# Patient Record
Sex: Male | Born: 1950 | Race: White | Hispanic: No | Marital: Married | Smoking: Former smoker
Health system: Southern US, Community
[De-identification: ages and names within clinical notes are randomized; demographics above are authoritative.]

## PROBLEM LIST (undated history)

## (undated) DIAGNOSIS — I2510 Atherosclerotic heart disease of native coronary artery without angina pectoris: Secondary | ICD-10-CM

## (undated) DIAGNOSIS — F32A Depression, unspecified: Secondary | ICD-10-CM

## (undated) DIAGNOSIS — K219 Gastro-esophageal reflux disease without esophagitis: Secondary | ICD-10-CM

## (undated) DIAGNOSIS — E079 Disorder of thyroid, unspecified: Secondary | ICD-10-CM

## (undated) DIAGNOSIS — M1990 Unspecified osteoarthritis, unspecified site: Secondary | ICD-10-CM

## (undated) DIAGNOSIS — F329 Major depressive disorder, single episode, unspecified: Secondary | ICD-10-CM

## (undated) DIAGNOSIS — C801 Malignant (primary) neoplasm, unspecified: Secondary | ICD-10-CM

## (undated) DIAGNOSIS — E039 Hypothyroidism, unspecified: Secondary | ICD-10-CM

## (undated) DIAGNOSIS — F1910 Other psychoactive substance abuse, uncomplicated: Secondary | ICD-10-CM

## (undated) HISTORY — DX: Atherosclerotic heart disease of native coronary artery without angina pectoris: I25.10

## (undated) HISTORY — DX: Major depressive disorder, single episode, unspecified: F32.9

## (undated) HISTORY — PX: APPENDECTOMY: SHX54

## (undated) HISTORY — DX: Disorder of thyroid, unspecified: E07.9

## (undated) HISTORY — DX: Depression, unspecified: F32.A

## (undated) HISTORY — DX: Other psychoactive substance abuse, uncomplicated: F19.10

## (undated) HISTORY — DX: Unspecified osteoarthritis, unspecified site: M19.90

## (undated) HISTORY — PX: LUMBAR DISC SURGERY: SHX700

---

## 2004-11-17 HISTORY — PX: NECK SURGERY: SHX720

## 2007-11-18 HISTORY — PX: MEDIAL PARTIAL KNEE REPLACEMENT: SHX5965

## 2008-01-12 ENCOUNTER — Inpatient Hospital Stay (HOSPITAL_COMMUNITY): Admission: RE | Admit: 2008-01-12 | Discharge: 2008-01-15 | Payer: Self-pay | Admitting: Orthopedic Surgery

## 2008-03-28 ENCOUNTER — Ambulatory Visit: Payer: Self-pay

## 2009-03-12 ENCOUNTER — Ambulatory Visit: Payer: Self-pay | Admitting: Internal Medicine

## 2009-03-12 ENCOUNTER — Encounter: Payer: Self-pay | Admitting: Internal Medicine

## 2009-03-12 DIAGNOSIS — Z860100 Personal history of colon polyps, unspecified: Secondary | ICD-10-CM | POA: Insufficient documentation

## 2009-03-12 DIAGNOSIS — R05 Cough: Secondary | ICD-10-CM | POA: Insufficient documentation

## 2009-03-12 DIAGNOSIS — Z8659 Personal history of other mental and behavioral disorders: Secondary | ICD-10-CM

## 2009-03-12 DIAGNOSIS — S6710XA Crushing injury of unspecified finger(s), initial encounter: Secondary | ICD-10-CM | POA: Insufficient documentation

## 2009-03-12 DIAGNOSIS — K279 Peptic ulcer, site unspecified, unspecified as acute or chronic, without hemorrhage or perforation: Secondary | ICD-10-CM

## 2009-03-12 DIAGNOSIS — M159 Polyosteoarthritis, unspecified: Secondary | ICD-10-CM | POA: Insufficient documentation

## 2009-03-12 DIAGNOSIS — Z8709 Personal history of other diseases of the respiratory system: Secondary | ICD-10-CM | POA: Insufficient documentation

## 2009-03-14 ENCOUNTER — Encounter (INDEPENDENT_AMBULATORY_CARE_PROVIDER_SITE_OTHER): Payer: Self-pay | Admitting: *Deleted

## 2009-04-02 ENCOUNTER — Ambulatory Visit: Payer: Self-pay | Admitting: Internal Medicine

## 2009-04-20 ENCOUNTER — Telehealth: Payer: Self-pay | Admitting: Internal Medicine

## 2009-05-08 ENCOUNTER — Telehealth: Payer: Self-pay | Admitting: Internal Medicine

## 2009-05-28 ENCOUNTER — Ambulatory Visit: Payer: Self-pay | Admitting: Gastroenterology

## 2009-06-13 ENCOUNTER — Ambulatory Visit: Payer: Self-pay | Admitting: Gastroenterology

## 2009-06-19 ENCOUNTER — Telehealth: Payer: Self-pay | Admitting: Internal Medicine

## 2009-07-12 ENCOUNTER — Ambulatory Visit: Payer: Self-pay | Admitting: Gastroenterology

## 2009-09-03 ENCOUNTER — Ambulatory Visit: Payer: Self-pay | Admitting: Internal Medicine

## 2009-09-03 DIAGNOSIS — M959 Acquired deformity of musculoskeletal system, unspecified: Secondary | ICD-10-CM

## 2009-09-04 ENCOUNTER — Telehealth: Payer: Self-pay | Admitting: Internal Medicine

## 2009-09-26 ENCOUNTER — Encounter (INDEPENDENT_AMBULATORY_CARE_PROVIDER_SITE_OTHER): Payer: Self-pay | Admitting: *Deleted

## 2010-02-25 ENCOUNTER — Telehealth: Payer: Self-pay | Admitting: Internal Medicine

## 2010-04-11 ENCOUNTER — Telehealth: Payer: Self-pay | Admitting: Internal Medicine

## 2010-04-18 ENCOUNTER — Telehealth: Payer: Self-pay | Admitting: Internal Medicine

## 2010-12-08 ENCOUNTER — Encounter: Payer: Self-pay | Admitting: Internal Medicine

## 2010-12-17 NOTE — Progress Notes (Signed)
  Prescriptions: VALIUM 10 MG TABS (DIAZEPAM) 1/2 - 1 by mouth three times a day as needed for spasms  #60 x 3   Entered and Authorized by:   Etta Grandchild MD   Signed by:   Etta Grandchild MD on 04/18/2010   Method used:   Print then Give to Patient   RxID:   [id number]   Appended Document:  Spoke with patient and rx called in to [facility] [address] ([phone])/[place]

## 2010-12-17 NOTE — Progress Notes (Signed)
  Phone Note Refill Request Message from:  Fax from Pharmacy on Apr 11, 2010 10:51 AM  Refills Requested: Medication #1:  VALIUM 10 MG TABS 1/2 - 1 by mouth three times a day as needed for spasms   Last Refilled: 4/12/20111   Please advise if ok to refill for pt?  Initial call taken by: Rock Nephew CMA,  Apr 11, 2010 10:52 AM  Follow-up for Phone Call        needs to be seen Follow-up by: Etta Grandchild MD,  Apr 11, 2010 11:24 AM     Appended Document:  Informed pt that he needs to be seen and he said he would like the MD to return his call. Pt wants to know why he has to come in. I informed pt that he hasn't been seen since October and he stated he doesn't have the money to throw around for an appt to get an RX? Please advise?

## 2010-12-17 NOTE — Progress Notes (Signed)
Summary: REFILL  Phone Note Refill Request Call back at Work Phone [phone]   Refills Requested: Medication #1:  OXYCODONE HCL 10 MG TABS 1-2 by mouth three times a day as needed for pain  Medication #2:  VALIUM 10 MG TABS 1/2 - 1 by mouth three times a day as needed for spasms Initial call taken by: Lamar Sprinkles, CMA,  February 25, 2010 1:19 PM  Follow-up for Phone Call        ok Follow-up by: Etta Grandchild MD,  February 25, 2010 1:26 PM  Additional Follow-up for Phone Call Additional follow up Details #1::        Pt informed  Additional Follow-up by: Lamar Sprinkles, CMA,  February 26, 2010 8:10 AM    Prescriptions: SYMBICORT 80-4.5 MCG/ACT AERO (BUDESONIDE-FORMOTEROL FUMARATE) 2 puffs BID  #2 inhs x 0   Entered by:   Lamar Sprinkles, CMA   Authorized by:   Etta Grandchild MD   Signed by:   Lamar Sprinkles, CMA on 02/26/2010   Method used:   Samples Given   RxID:   [id number] VALIUM 10 MG TABS (DIAZEPAM) 1/2 - 1 by mouth three times a day as needed for spasms  #60 x 0   Entered by:   Lamar Sprinkles, CMA   Authorized by:   Etta Grandchild MD   Signed by:   Lamar Sprinkles, CMA on 02/26/2010   Method used:   Print then Give to Patient   RxID:   [id number] OXYCODONE HCL 10 MG TABS (OXYCODONE HCL) 1-2 by mouth three times a day as needed for pain  #100 x 0   Entered by:   Lamar Sprinkles, CMA   Authorized by:   Etta Grandchild MD   Signed by:   Lamar Sprinkles, CMA on 02/26/2010   Method used:   Print then Give to Patient   RxID:   [id number]

## 2011-04-01 NOTE — Discharge Summary (Signed)
[name], [name]                ACCOUNT NO.:  [id number]   MEDICAL RECORD NO.:  [id number]          PATIENT TYPE:  INP   LOCATION:  5038                         FACILITY:  [facility]   PHYSICIAN:  Eulas Post, MD    DATE OF BIRTH:  02-Dec-1950   DATE OF ADMISSION:  01/12/2008  DATE OF DISCHARGE:  01/15/2008                               DISCHARGE SUMMARY   ADMISSION DIAGNOSIS:  Right knee unicompartment arthritis.   DISCHARGE DIAGNOSIS:  Right knee unicompartment arthritis.   SECONDARY DIAGNOSIS:  Narcotic tolerance.   DISCHARGE MEDICATIONS:  1. Coumadin for a period of 4 weeks postoperatively per pharmacy      protocol.  2. Valium 10 mg p.o. t.i.d. p.r.n. muscle spasms.  3. Norco 10/325 1-2 tablets p.o. q.6 h. p.r.n. pain, dispensing 90      with 1 refill.  4. OxyContin continuous release 20 mg p.o. b.i.d.  5. Sleeping medication 8 mg p.o. q.h.s. p.r.n., dispensing 30 with no      refills.  6. Resume other home medications except for narcotics.   HOSPITAL COURSE:  [name] Haney elected to undergo a right  unicompartmental knee arthroplasty.  He tolerated the procedure well and  there were no complications.  He was given PCA morphine in the early  postoperative period and then transitioned to oral pain medications.  He  has a history of cervical spine fusion and requires a fair amount of  narcotics.  He was given DVT prophylaxis in the form of sequential  compression devices as well as Coumadin postoperatively.  He was also  given perioperative antibiotics.  He is working with physical therapy  and doing well at the time of discharge.  He used the CPM early on, but  had excruciating pain and what he considers to be a setback because of  the CPM, so we have discontinued the CPM.   He is planned to be discharged home with follow up with me in  approximately 4 weeks.  All the prescriptions have been given.  The  patient had maximum benefit from his hospital stay and there  were no  complications.      Eulas Post, MD  Electronically Signed     JPL/MEDQ  D:  01/14/2008  T:  01/15/2008  Job:  [id number]

## 2011-04-01 NOTE — Op Note (Signed)
[name] Haney, [name] Haney                ACCOUNT NO.:  [id number]   MEDICAL RECORD NO.:  [id number]          PATIENT TYPE:  INP   LOCATION:  2550                         FACILITY:  [facility]   PHYSICIAN:  Eulas Post, MD    DATE OF BIRTH:  Dec 15, 1950   DATE OF PROCEDURE:  01/12/2008  DATE OF DISCHARGE:                               OPERATIVE REPORT   PREOPERATIVE DIAGNOSIS:  Right knee medial compartment osteoarthritis.   POSTOPERATIVE DIAGNOSIS:  Right knee medial compartment osteoarthritis.   OPERATION PERFORMED:  Right knee medial compartment unicompartmental  knee arthroplasty.   SURGEON:  Eulas Post, MD   FIRST ASSISTANT:  Loreta Ave, M.D.   ANESTHESIA:  General with a femoral nerve block.   ESTIMATED BLOOD LOSS:  Minimal.   TOURNIQUET TIME:  Two hours.   ANTIBIOTICS:  1 g intravenous Ancef given preoperatively.   OPERATIVE IMPLANTS:  Stryker PKR size 3 tibia, size 3 femur, size 9 mm  polyethylene insert.   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  There was evidence of a prior meniscectomy.  The knee had full range of motion both before and after surgery.  It was  stable to varus and valgus stress postoperatively and had a normal  amount of medial laxity.  It reached full extension postoperatively.  His patellofemoral joint was normal.  His lateral joint was normal.  His  ACL was intact.  His PCL was intact.   INDICATIONS FOR PROCEDURE:  [name] is a 60 year old man who  had a prior right knee open subtotal medial meniscectomy performed in  1970.  He presented for medial sided knee pain and elected to undergo  the above named procedure.  The risks, benefits and alternatives of  operative intervention were discussed with him preoperatively including  but not limited to the risks of infection, bleeding, nerve injury, blood  clots, cardiopulmonary complications, pulmonary embolus, the need for  blood transfusion, knee stiffness, loss of function, the need for  revision surgery to a total knee among others and the patient is willing  to proceed.   DESCRIPTION OF PROCEDURE:  The patient was brought to the operating room  and placed in supine position.  Femoral nerve block is administered.  General anesthesia was given.  1 g intravenous Ancef was given.  The  right lower extremity was prepped and draped in the usual sterile  fashion.  Tourniquet was applied and set for 300.  After sterile prep  and drape, incision was made using the parapatellar approach.  Incision  was not carried out superior to the superior pole of the patella.  Exposure was obtained and the medial meniscus was excised and  osteophytes were removed.  We assembled our jig and made a tibial cut.  We then adjusted the jig appropriately as we needed to take more tibia.  A second tibial cut was performed.  Care was taken not to undercut the  tibial spine.  The medial collateral ligament was protected.  Once the  tibial cut had been achieved,  we then checked our flexion and extension  gap and assembled our femoral jig and performed our distal femoral  resection followed by posterior cut and our chamfer cut.  Care was taken  to recess the femoral component such that it does not enter into the  patellar contact region.  Trial components were placed and the knee was  taken through a range of motion and found to have excellent stability  with satisfactory motion.  We then prepared our tibia with the  appropriate drill and prepared our femur, irrigated the knee copiously.  One liter of fluid was irrigated prior to cementing and one liter was  irrigated after cementing.  We then cemented all the components into  placed.  Excellent bony conformity was achieved and the knee was  irrigated copiously and the parapatellar tissue closed with 0-0 Vicryl  followed by 2-0 in the subcutaneous tissue and Monocryl for the skin.   Steri-Strips were placed.  The knee was injected with Marcaine, total of  20 mL.  Sterile dressing was applied followed by TED hose.  The patient  was awakened and returned to PACU in stable and satisfactory condition.  There were no complications.  The patient tolerated the procedure well.      Eulas Post, MD  Electronically Signed     JPL/MEDQ  D:  01/12/2008  T:  01/13/2008  Job:  [id number] [id number]

## 2011-04-18 ENCOUNTER — Other Ambulatory Visit (INDEPENDENT_AMBULATORY_CARE_PROVIDER_SITE_OTHER): Payer: PRIVATE HEALTH INSURANCE

## 2011-04-18 ENCOUNTER — Encounter: Payer: Self-pay | Admitting: Internal Medicine

## 2011-04-18 ENCOUNTER — Ambulatory Visit (INDEPENDENT_AMBULATORY_CARE_PROVIDER_SITE_OTHER): Payer: PRIVATE HEALTH INSURANCE | Admitting: Internal Medicine

## 2011-04-18 ENCOUNTER — Other Ambulatory Visit: Payer: Self-pay | Admitting: Internal Medicine

## 2011-04-18 DIAGNOSIS — J45909 Unspecified asthma, uncomplicated: Secondary | ICD-10-CM

## 2011-04-18 DIAGNOSIS — M1990 Unspecified osteoarthritis, unspecified site: Secondary | ICD-10-CM

## 2011-04-18 DIAGNOSIS — N529 Male erectile dysfunction, unspecified: Secondary | ICD-10-CM

## 2011-04-18 DIAGNOSIS — M545 Low back pain, unspecified: Secondary | ICD-10-CM

## 2011-04-18 LAB — CBC WITH DIFFERENTIAL/PLATELET
Basophils Relative: 0.6 % (ref 0.0–3.0)
Eosinophils Relative: 1.2 % (ref 0.0–5.0)
HCT: 44.6 % (ref 39.0–52.0)
Hemoglobin: 15.5 g/dL (ref 13.0–17.0)
Lymphs Abs: 3.4 10*3/uL (ref 0.7–4.0)
MCV: 91.8 fl (ref 78.0–100.0)
Monocytes Absolute: 0.8 10*3/uL (ref 0.1–1.0)
Neutro Abs: 7.4 10*3/uL (ref 1.4–7.7)
Platelets: 259 10*3/uL (ref 150.0–400.0)
WBC: 11.9 10*3/uL — ABNORMAL HIGH (ref 4.5–10.5)

## 2011-04-18 LAB — COMPREHENSIVE METABOLIC PANEL
ALT: 10 U/L (ref 0–53)
AST: 14 U/L (ref 0–37)
CO2: 29 mEq/L (ref 19–32)
Calcium: 9.1 mg/dL (ref 8.4–10.5)
Chloride: 106 mEq/L (ref 96–112)
GFR: 61.3 mL/min (ref >60.00)
Potassium: 4.4 mEq/L (ref 3.5–5.1)
Sodium: 141 mEq/L (ref 135–145)
Total Protein: 7.1 g/dL (ref 6.0–8.3)

## 2011-04-18 LAB — URINALYSIS, ROUTINE W REFLEX MICROSCOPIC
Bilirubin Urine: NEGATIVE
Nitrite: NEGATIVE
Total Protein, Urine: NEGATIVE
Urine Glucose: NEGATIVE
pH: 5.5 (ref 5.0–8.0)

## 2011-04-18 LAB — LIPID PANEL
Cholesterol: 265 mg/dL — ABNORMAL HIGH (ref 0–200)
Total CHOL/HDL Ratio: 6
VLDL: 42 mg/dL — ABNORMAL HIGH (ref 0.0–40.0)

## 2011-04-18 LAB — HEMOGLOBIN A1C: Hgb A1c MFr Bld: 5.6 % (ref 4.6–6.5)

## 2011-04-18 LAB — TSH: TSH: 6.66 u[IU]/mL — ABNORMAL HIGH (ref 0.35–5.50)

## 2011-04-18 LAB — LDL CHOLESTEROL, DIRECT: Direct LDL: 222 mg/dL

## 2011-04-18 MED ORDER — MOMETASONE FURO-FORMOTEROL FUM 200-5 MCG/ACT IN AERO: 2.0000 | INHALATION_SPRAY | Freq: Two times a day (BID) | RESPIRATORY_TRACT | Status: DC

## 2011-04-18 MED ORDER — DIAZEPAM 10 MG PO TABS: 10.0000 mg | ORAL_TABLET | Freq: Three times a day (TID) | ORAL | Status: DC | PRN

## 2011-04-18 MED ORDER — OXYCODONE HCL 10 MG PO TB12: 10.0000 mg | ORAL_TABLET | Freq: Two times a day (BID) | ORAL | Status: DC | PRN

## 2011-04-18 NOTE — Assessment & Plan Note (Signed)
I want him to restart an inhaler so I gave him samples of [facility]

## 2011-04-18 NOTE — Assessment & Plan Note (Signed)
I will check labs to look for secondary causes of ED and ask him to come back in for a fasting physical soon

## 2011-04-18 NOTE — Progress Notes (Signed)
Subjective:    Patient ID: [name] Haney, male    DOB: 1951/05/20, 60 y.o.   MRN: [id number]  Back Pain This is a chronic problem. The current episode started more than 1 year ago. The problem occurs intermittently. The problem is unchanged. The pain is present in the lumbar spine. The quality of the pain is described as aching. The pain does not radiate. The pain is at a severity of 4/10. The pain is mild. The pain is worse during the day. The symptoms are aggravated by bending. Stiffness is present all day. Pertinent negatives include no abdominal pain, bladder incontinence, bowel incontinence, chest pain, dysuria, fever, headaches, leg pain, numbness, paresis, paresthesias, pelvic pain, perianal numbness, tingling, weakness or weight loss. He has tried analgesics for the symptoms. The treatment provided significant relief.  Erectile Dysfunction This is a new problem. The current episode started more than 1 month ago. The problem is unchanged. The nature of his difficulty is achieving erection, maintaining erection and penetration. Non-physiologic factors contributing to erectile dysfunction are a decreased libido. He reports no anxiety or performance anxiety. He reports his erection duration to be 1 to 5 minutes. Irritative symptoms do not include frequency, nocturia or urgency. Obstructive symptoms do not include dribbling, incomplete emptying, an intermittent stream, a slower stream, straining or a weak stream. Pertinent negatives include no chills, dysuria, genital pain, hematuria, hesitancy or inability to urinate. The symptoms are aggravated by nothing. Past treatments include nothing.  Asthma He complains of cough and wheezing. There is no chest tightness, difficulty breathing, frequent throat clearing, hemoptysis, shortness of breath or sputum production. This is a chronic problem. The current episode started more than 1 year ago. The problem occurs intermittently. The problem has been unchanged.  The cough is non-productive. Pertinent negatives include no appetite change, chest pain, dyspnea on exertion, ear congestion, ear pain, fever, headaches, heartburn, malaise/fatigue, myalgias, nasal congestion, orthopnea, PND, postnasal drip, rhinorrhea, sneezing, sore throat, sweats, trouble swallowing or weight loss. His symptoms are aggravated by pollen and exposure to smoke. His symptoms are alleviated by steroid inhaler and beta-agonist. He reports moderate improvement on treatment. His past medical history is significant for asthma. There is no history of bronchiectasis, bronchitis, COPD, emphysema or pneumonia.      Review of Systems  Constitutional: Positive for fatigue and unexpected weight change (weight gain). Negative for fever, chills, weight loss, malaise/fatigue, diaphoresis, activity change and appetite change.  HENT: Negative for ear pain, sore throat, facial swelling, rhinorrhea, sneezing, trouble swallowing, neck pain, neck stiffness and postnasal drip.   Eyes: Negative for photophobia, redness and visual disturbance.  Respiratory: Positive for cough and wheezing. Negative for apnea, hemoptysis, sputum production, choking, chest tightness, shortness of breath and stridor.   Cardiovascular: Negative for chest pain, dyspnea on exertion, palpitations, leg swelling and PND.  Gastrointestinal: Negative for heartburn, nausea, vomiting, abdominal pain, diarrhea, constipation, blood in stool, abdominal distention, anal bleeding, rectal pain and bowel incontinence.  Genitourinary: Positive for decreased libido. Negative for bladder incontinence, dysuria, hesitancy, urgency, frequency, hematuria, flank pain, decreased urine volume, discharge, penile swelling, scrotal swelling, enuresis, difficulty urinating, genital sores, penile pain, testicular pain, pelvic pain, incomplete emptying and nocturia.  Musculoskeletal: Positive for back pain. Negative for myalgias, joint swelling, arthralgias and  gait problem.  Skin: Negative for color change, pallor and rash.  Neurological: Negative for dizziness, tingling, tremors, seizures, syncope, facial asymmetry, speech difficulty, weakness, light-headedness, numbness, headaches and paresthesias.  Hematological: Negative for adenopathy. Does not bruise/bleed easily.  Psychiatric/Behavioral: Negative for suicidal ideas, hallucinations, behavioral problems, confusion, sleep disturbance, self-injury, dysphoric mood, decreased concentration and agitation. The patient is not nervous/anxious and is not hyperactive.        Objective:   Physical Exam  [vitalsreviewed. Constitutional: He is oriented to person, place, and time. He appears well-developed and well-nourished. No distress.  HENT:  Head: Normocephalic and atraumatic.  Right Ear: External ear normal.  Left Ear: External ear normal.  Nose: Nose normal.  Mouth/Throat: Oropharynx is clear and moist. No oropharyngeal exudate.  Eyes: Conjunctivae and EOM are normal. Pupils are equal, round, and reactive to light. Right eye exhibits no discharge. Left eye exhibits no discharge. No scleral icterus.  Neck: Normal range of motion. Neck supple. No JVD present. No tracheal deviation present. No thyromegaly present.  Cardiovascular: Normal rate, regular rhythm, normal heart sounds and intact distal pulses.  Exam reveals no gallop and no friction rub.   No murmur heard. Pulmonary/Chest: Effort normal. No stridor. No respiratory distress. He has wheezes (he has mild diffuse inspiratory wheezes but good air movement). He has no rales. He exhibits no tenderness.  Abdominal: Soft. Bowel sounds are normal. He exhibits no distension and no mass. There is no tenderness. There is no rebound and no guarding.  Musculoskeletal: Normal range of motion. He exhibits no edema and no tenderness.       Lumbar back: Normal. He exhibits normal range of motion, no tenderness, no bony tenderness, no swelling, no edema, no  pain and no spasm.  Lymphadenopathy:    He has no cervical adenopathy.  Neurological: He is alert and oriented to person, place, and time. He displays no atrophy, no tremor and normal reflexes. No cranial nerve deficit or sensory deficit. He exhibits normal muscle tone. He displays no seizure activity. Coordination and gait normal. He displays no Babinski's sign on the right side. He displays no Babinski's sign on the left side.  Reflex Scores:      Tricep reflexes are 1+ on the right side and 1+ on the left side.      Bicep reflexes are 1+ on the right side and 1+ on the left side.      Brachioradialis reflexes are 1+ on the right side and 1+ on the left side.      Patellar reflexes are 1+ on the right side and 1+ on the left side.      Achilles reflexes are 1+ on the right side and 1+ on the left side. Skin: Skin is warm and dry. No rash noted. He is not diaphoretic. No erythema. No pallor.  Psychiatric: He has a normal mood and affect. His behavior is normal. Judgment and thought content normal.          Assessment & Plan:

## 2011-04-18 NOTE — Assessment & Plan Note (Signed)
I gave him refills on the meds he takes for pain and spasms, he has no evidence of radiculopathy

## 2011-04-18 NOTE — Patient Instructions (Signed)
Asthma, Adult Asthma is caused by narrowing of the air passages in the lungs. It may be triggered by pollen, dust, animal dander, molds, some foods, respiratory infections, exposure to smoke, exercise, emotional stress or other allergens (things that cause allergic reactions or allergies). Repeat attacks are common. HOME CARE INSTRUCTIONS  Use prescription medications as ordered by your caregiver.   Avoid pollen, dust, animal dander, molds, smoke and other things that cause attacks at home and at work.   You may have fewer attacks if you decrease dust in your home. Electrostatic air cleaners may help.   It may help to replace your pillows or mattress with materials less likely to cause allergies.   Talk to your caregiver about an action plan for managing asthma attacks at home, including, the use of a peak flow meter which measures the severity of your asthma attack. An action plan can help minimize or stop the attack without having to seek medical care.   If you are not on a fluid restriction, drink 8 to 10 glasses of water each day.   Always have a plan prepared for seeking medical attention, including, calling your physician, accessing local emergency care, and calling 911 (in the [place] for a severe attack.   Discuss possible exercise routines with your caregiver.   If animal dander is the cause of asthma, you may need to get rid of pets.  SEEK MEDICAL CARE IF:  You have wheezing and shortness of breath even if taking medicine to prevent attacks.   An oral temperature above 100.5 develops.   You have muscle aches, chest pain or thickening of sputum.   Your sputum changes from clear or white to yellow, green, gray or bloody.   You have any problems that may be related to the medicine you are taking (such as a rash, itching, swelling or trouble breathing).  SEEK IMMEDIATE MEDICAL CARE IF:  Your usual medicines do not stop your wheezing or there is increased coughing and/or  shortness of breath.   You have increased difficulty breathing.  You have an oral temperature above 100.5Back Pain (Lumbosacral Strain) Back pain is one of the most common causes of pain. There are many causes of back pain. Most are not serious conditions.  CAUSES Your backbone (spinal column) is made up of 24 main vertebral bodies, the sacrum, and the coccyx. These are held together by muscles and tough, fibrous tissue (ligaments). Nerve roots pass through the openings between the vertebrae. A sudden move or injury to the back may cause injury to, or pressure on, these nerves. This may result in localized back pain or pain movement (radiation) into the buttocks, down the leg, and into the foot. Sharp, shooting pain from the buttock down the back of the leg (sciatica) is frequently associated with a ruptured (herniated) disc. Pain may be caused by muscle spasm alone. Your caregiver can often find the cause of your pain by the details of your symptoms and an exam. In some cases, you may need tests (such as X-rays). Your caregiver will work with you to decide if any tests are needed based on your specific exam. HOME CARE INSTRUCTIONS Avoid an underactive lifestyle. Active exercise, as directed by your caregiver, is your greatest weapon against back pain.  Avoid hard physical activities (tennis, racquetball, water-skiing) if you are not in proper physical condition for it. This may aggravate and/or create problems.  If you have a back problem, avoid sports requiring sudden body movements. Swimming and walking  are generally safer activities.  Maintain good posture.  Avoid becoming overweight (obese).  Use bed rest for only the most extreme, sudden (acute) episode. Your caregiver will help you determine how much bed rest is necessary.  For acute conditions, you may put ice on the injured area.  Put ice in a plastic bag.  Place a towel between your skin and the bag.  Leave the ice on for 20 minutes at a  time, every 2 hours, or as needed.  After you are improved and more active, it may help to apply heat for 30 minutes before activities.  See your caregiver if you are having pain that lasts longer than expected. Your caregiver can advise appropriate exercises and/or therapy if needed. With conditioning, most back problems can be avoided. SEEK IMMEDIATE MEDICAL CARE IF: You have numbness, tingling, weakness, or problems with the use of your arms or legs.  You experience severe back pain not relieved with medicines.  There is a change in bowel or bladder control.  You have increasing pain in any area of the body, including your belly (abdomen).  You notice shortness of breath, dizziness, or feel faint.  You feel sick to your stomach (nauseous), are throwing up (vomiting), or become sweaty.  You notice discoloration of your toes or legs, or your feet get very cold.  Your back pain is getting worse.  You have an oral temperature above 100.5, not controlled by medicine.  MAKE SURE YOU:  Understand these instructions.  Will watch your condition.  Will get help right away if you are not doing well or get worse.  Document Released: 08/13/2005 Document Re-Released: 01/28/2010  [facility] Patient Information 2011 [place], [place]., not controlled by medicine.  MAKE SURE YOU:  Understand these instructions.   Will watch your condition.   Will get help right away if you are not doing well or get worse.  Document Released: 11/03/2005 Document Re-Released: 11/25/2009 [facility] Patient Information 2011 [place], [place].

## 2011-04-20 ENCOUNTER — Encounter: Payer: Self-pay | Admitting: Internal Medicine

## 2011-04-21 LAB — TESTOSTERONE, FREE, TOTAL, SHBG
Sex Hormone Binding: 21 nmol/L (ref 13–71)
Testosterone, Free: 45.5 pg/mL — ABNORMAL LOW (ref 47.0–244.0)

## 2011-04-25 ENCOUNTER — Telehealth: Payer: Self-pay | Admitting: *Deleted

## 2011-04-25 ENCOUNTER — Encounter: Payer: Self-pay | Admitting: Internal Medicine

## 2011-04-25 ENCOUNTER — Ambulatory Visit (INDEPENDENT_AMBULATORY_CARE_PROVIDER_SITE_OTHER): Payer: PRIVATE HEALTH INSURANCE | Admitting: Internal Medicine

## 2011-04-25 VITALS — BP 112/68 | HR 58 | Temp 97.9°F | Resp 16 | Wt 226.0 lb

## 2011-04-25 DIAGNOSIS — E291 Testicular hypofunction: Secondary | ICD-10-CM

## 2011-04-25 DIAGNOSIS — E039 Hypothyroidism, unspecified: Secondary | ICD-10-CM | POA: Insufficient documentation

## 2011-04-25 DIAGNOSIS — N529 Male erectile dysfunction, unspecified: Secondary | ICD-10-CM

## 2011-04-25 DIAGNOSIS — Z23 Encounter for immunization: Secondary | ICD-10-CM

## 2011-04-25 DIAGNOSIS — E7800 Pure hypercholesterolemia, unspecified: Secondary | ICD-10-CM

## 2011-04-25 MED ORDER — PITAVASTATIN CALCIUM 4 MG PO TABS: 1.0000 | ORAL_TABLET | Freq: Every day | ORAL | Status: DC

## 2011-04-25 MED ORDER — LEVOTHYROXINE SODIUM 50 MCG PO TABS: 50.0000 ug | ORAL_TABLET | Freq: Every day | ORAL | Status: DC

## 2011-04-25 MED ORDER — TESTOSTERONE 50 MG/5GM (1%) TD GEL: 5.0000 g | Freq: Every day | TRANSDERMAL | Status: DC

## 2011-04-25 MED ORDER — TESTOSTERONE 20.25 MG/ACT (1.62%) TD GEL: 2.0000 | Freq: Every day | TRANSDERMAL | Status: DC

## 2011-04-25 NOTE — Assessment & Plan Note (Signed)
Start Livalo 

## 2011-04-25 NOTE — Telephone Encounter (Signed)
Patient requesting alt rx to testosterone RX given today. He can not afford RX.

## 2011-04-25 NOTE — Assessment & Plan Note (Signed)
Start synthroid

## 2011-04-25 NOTE — Assessment & Plan Note (Signed)
Start androgel pump

## 2011-04-25 NOTE — Assessment & Plan Note (Signed)
Treat the low testosterone and hypothyroidism and look for improvement

## 2011-04-25 NOTE — Telephone Encounter (Signed)
Please call in testim instead

## 2011-04-25 NOTE — Progress Notes (Signed)
Subjective:    Patient ID: [name] Haney, male    DOB: 12/04/1950, 60 y.o.   MRN: [id number]  Erectile Dysfunction This is a chronic problem. The current episode started more than 1 year ago. The problem has been gradually worsening since onset. The nature of his difficulty is achieving erection, maintaining erection and penetration. Non-physiologic factors contributing to erectile dysfunction are a decreased libido. He reports no anxiety or performance anxiety. Irritative symptoms do not include frequency, nocturia or urgency. Obstructive symptoms do not include dribbling, incomplete emptying, an intermittent stream, a slower stream, straining or a weak stream. Pertinent negatives include no chills, dysuria, genital pain, hematuria, hesitancy or inability to urinate. The symptoms are aggravated by medications. Past treatments include nothing.  Hyperlipidemia This is a new problem. The problem is uncontrolled. Recent lipid tests were reviewed and are high. Exacerbating diseases include obesity. He has no history of chronic renal disease, diabetes, liver disease or nephrotic syndrome. Factors aggravating his hyperlipidemia include fatty foods. Pertinent negatives include no chest pain, focal sensory loss, focal weakness, leg pain, myalgias or shortness of breath. He is currently on no antihyperlipidemic treatment. Compliance problems include adherence to diet and adherence to exercise.       Review of Systems  Constitutional: Positive for fatigue. Negative for fever, chills, diaphoresis, activity change, appetite change and unexpected weight change.  HENT: Negative for sore throat, facial swelling, trouble swallowing, neck pain, neck stiffness and voice change.   Eyes: Negative for photophobia and visual disturbance.  Respiratory: Negative for cough, choking, chest tightness, shortness of breath, wheezing and stridor.   Cardiovascular: Negative for chest pain, palpitations and leg swelling.    Gastrointestinal: Negative for nausea, abdominal pain, diarrhea, constipation, blood in stool, abdominal distention, anal bleeding and rectal pain.  Genitourinary: Positive for decreased libido. Negative for dysuria, hesitancy, urgency, frequency, hematuria, flank pain, decreased urine volume, discharge, penile swelling, scrotal swelling, enuresis, difficulty urinating, genital sores, penile pain, testicular pain, incomplete emptying and nocturia.  Musculoskeletal: Negative for myalgias, back pain, joint swelling, arthralgias and gait problem.  Skin: Negative for color change, pallor and rash.  Neurological: Negative for dizziness, tremors, focal weakness, seizures, syncope, facial asymmetry, speech difficulty, weakness, light-headedness, numbness and headaches.  Hematological: Negative for adenopathy. Does not bruise/bleed easily.  Psychiatric/Behavioral: Positive for dysphoric mood and decreased concentration. Negative for suicidal ideas, hallucinations, behavioral problems, confusion, sleep disturbance, self-injury and agitation. The patient is not nervous/anxious and is not hyperactive.        Objective:   Physical Exam  Vitals reviewed. Constitutional: He is oriented to person, place, and time. He appears well-developed and well-nourished. No distress.  HENT:  Head: Normocephalic and atraumatic.  Right Ear: External ear normal.  Left Ear: External ear normal.  Nose: Nose normal.  Mouth/Throat: Oropharynx is clear and moist. No oropharyngeal exudate.  Eyes: Conjunctivae and EOM are normal. Pupils are equal, round, and reactive to light. Right eye exhibits no discharge. Left eye exhibits no discharge. No scleral icterus.  Neck: Normal range of motion. Neck supple. No JVD present. No tracheal deviation present. No thyromegaly present.  Cardiovascular: Normal rate, regular rhythm, normal heart sounds and intact distal pulses.  Exam reveals no gallop and no friction rub.   No murmur  heard. Pulmonary/Chest: Effort normal and breath sounds normal. No respiratory distress. He has no wheezes. He has no rales. He exhibits no tenderness.  Abdominal: Soft. Bowel sounds are normal. He exhibits no distension and no mass. There is no tenderness.  There is no rebound and no guarding. Hernia confirmed negative in the right inguinal area and confirmed negative in the left inguinal area.  Genitourinary: Rectum normal, testes normal and penis normal. Rectal exam shows no external hemorrhoid, no internal hemorrhoid, no fissure, no mass, no tenderness and anal tone normal. Guaiac negative stool. Prostate is not enlarged and not tender. Right testis shows no mass, no swelling and no tenderness. Left testis shows no mass, no swelling and no tenderness. No penile erythema or penile tenderness. No discharge found.  Musculoskeletal: Normal range of motion. He exhibits no edema and no tenderness.  Lymphadenopathy:    He has no cervical adenopathy.       Right: No inguinal adenopathy present.       Left: No inguinal adenopathy present.  Neurological: He is alert and oriented to person, place, and time. He has normal reflexes. He displays normal reflexes. No cranial nerve deficit. He exhibits normal muscle tone. Coordination normal.  Skin: Skin is warm and dry. No rash noted. He is not diaphoretic. No erythema. No pallor.  Psychiatric: He has a normal mood and affect. His behavior is normal. Judgment and thought content normal.         Lab Results  Component Value Date   WBC 11.9* 04/18/2011   HGB 15.5 04/18/2011   HCT 44.6 04/18/2011   PLT 259.0 04/18/2011   CHOL 265* 04/18/2011   TRIG 210.0* 04/18/2011   HDL 43.40 04/18/2011   LDLDIRECT 222.0 04/18/2011   ALT 10 04/18/2011   AST 14 04/18/2011   NA 141 04/18/2011   K 4.4 04/18/2011   CL 106 04/18/2011   CREATININE 1.3 04/18/2011   BUN 19 04/18/2011   CO2 29 04/18/2011   TSH 6.66* 04/18/2011   PSA 0.40 04/18/2011   HGBA1C 5.6 04/18/2011   Assessment & Plan:

## 2011-04-25 NOTE — Patient Instructions (Signed)
Hypothyroidism The thyroid is a large gland located in the lower front of your neck. The thyroid gland helps control metabolism. Metabolism is how your body handles food. It controls metabolism with the hormone thyroxine. When this gland is underactive (hypothyroid), it produces too little hormone.  SYMPTOMS OF HYPOTHYROIDISM  Lethargy (feeling as though you have no energy)   Cold intolerance   Weight gain (in spite of normal food intake)   Dry skin   Coarse hair   Menstrual irregularity (if severe, may lead to infertility)   Slowing of thought processes  Cardiac problems are also caused by insufficient amounts of thyroid hormone. Hypothyroidism in the newborn is cretinism, and is an extreme form. It is important that this form be treated adequately and immediately or it will lead rapidly to retarded physical and mental development. CAUSES OF HYPOTHYROIDISM These include:   Absence or destruction of thyroid tissue.  Goiter due to iodine deficiency.   Goiter due to medications.   Congenital defects (since birth).  Problems with the pituitary. This causes a lack of TSH (thyroid stimulating hormone). This hormone tells the thyroid to turn out more hormone.   DIAGNOSIS To prove hypothyroidism, your caregiver may do blood tests and ultrasound tests. Sometimes the signs are hidden. It may be necessary for your caregiver to watch this illness with blood tests either before or after diagnosis and treatment. TREATMENT  Low levels of thyroid hormone are increased by using synthetic thyroid hormone. This is a safe, effective treatment. It usually takes about four weeks to gain the full effects of the medication. After you have the full effect of the medication, it will generally take another four weeks for problems to leave. Your caregiver may start you on low doses. If you have had heart problems the dose may be gradually increased. It is generally not an emergency to get rapidly to  normal. HOME CARE INSTRUCTIONS  Take your medications as your caregiver suggests. Let your caregiver know of any medications you are taking or start taking. Your caregiver will help you with dosage schedules.   As your condition improves, your dosage needs may increase. It will be necessary to have continuing blood tests as suggested by your caregiver.   Report all suspected medication side effects to your caregiver.  SEEK MEDICAL CARE IF YOU DEVELOP:  Sweating.  Tremulousness (tremors).   Anxiety.   Rapid weight loss.   Heat intolerance.  Emotional swings.   Diarrhea.   Weakness.   SEEK IMMEDIATE MEDICAL CARE IF: You develop chest pain, an irregular heart beat (palpitations), or a rapid heart beat. MAKE SURE YOU:   Understand these instructions.   Will watch your condition.   Will get help right away if you are not doing well or get worse.  Document Released: 11/03/2005 Document Re-Released: 10/16/2008 [employer] Patient Information 2011 [employer]. 

## 2011-04-28 NOTE — Telephone Encounter (Signed)
Spoke w/pt. He uses [facility] [address]. Attempted to call pharm and check price of testim. Busy x 3, will try later.

## 2011-04-29 NOTE — Telephone Encounter (Signed)
Called into pharm, they req I call back to give them time to run RX.

## 2011-04-29 NOTE — Telephone Encounter (Signed)
Androderm is too expensive and testim is not covered at all. What do you advise?

## 2011-04-30 MED ORDER — TESTOSTERONE CYPIONATE 200 MG/ML IM SOLN: 200.0000 mg | INTRAMUSCULAR | Status: DC

## 2011-04-30 NOTE — Telephone Encounter (Signed)
injection

## 2011-04-30 NOTE — Telephone Encounter (Signed)
Pt aware, faxed rx to [facility] [address].

## 2011-05-02 ENCOUNTER — Ambulatory Visit (INDEPENDENT_AMBULATORY_CARE_PROVIDER_SITE_OTHER): Payer: PRIVATE HEALTH INSURANCE

## 2011-05-02 DIAGNOSIS — E291 Testicular hypofunction: Secondary | ICD-10-CM

## 2011-05-02 MED ORDER — TESTOSTERONE CYPIONATE 200 MG/ML IM SOLN
200.0000 mg | Freq: Once | INTRAMUSCULAR | Status: AC
  Administered 2011-05-02: 200 mg via INTRAMUSCULAR

## 2011-05-08 ENCOUNTER — Ambulatory Visit: Payer: PRIVATE HEALTH INSURANCE | Admitting: Internal Medicine

## 2011-06-26 ENCOUNTER — Ambulatory Visit (INDEPENDENT_AMBULATORY_CARE_PROVIDER_SITE_OTHER)
Admission: RE | Admit: 2011-06-26 | Discharge: 2011-06-26 | Disposition: A | Discharge status: Home / Self Care | Payer: PRIVATE HEALTH INSURANCE | Source: Ambulatory Visit | Attending: Internal Medicine | Admitting: Internal Medicine

## 2011-06-26 ENCOUNTER — Ambulatory Visit (INDEPENDENT_AMBULATORY_CARE_PROVIDER_SITE_OTHER): Payer: PRIVATE HEALTH INSURANCE | Admitting: Internal Medicine

## 2011-06-26 ENCOUNTER — Encounter: Payer: Self-pay | Admitting: Internal Medicine

## 2011-06-26 VITALS — BP 118/86 | HR 68 | Temp 97.6°F | Resp 16

## 2011-06-26 DIAGNOSIS — S99912A Unspecified injury of left ankle, initial encounter: Secondary | ICD-10-CM

## 2011-06-26 DIAGNOSIS — M7660 Achilles tendinitis, unspecified leg: Secondary | ICD-10-CM

## 2011-06-26 DIAGNOSIS — S99919A Unspecified injury of unspecified ankle, initial encounter: Secondary | ICD-10-CM

## 2011-06-26 DIAGNOSIS — S99929A Unspecified injury of unspecified foot, initial encounter: Secondary | ICD-10-CM

## 2011-06-26 NOTE — Assessment & Plan Note (Signed)
I gave him pt ed material about achilles tendonitis

## 2011-06-26 NOTE — Assessment & Plan Note (Signed)
I will check a plain xray today to look for fracture, etc
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
## 2011-06-26 NOTE — Patient Instructions (Signed)
Achilles Tendonitis (Tendinitis) Tendonitis a swelling and soreness of the tendon. The pain in the tendon (cord like structure which attaches muscle to bone) is produced by tiny tears and the inflammation present in that tendon. It commonly occurs at the shoulders, heels, and elbows. It is usually caused by overusing the tendon and joint involved. Achilles tendonitis involves the Achilles tendon. This is the large tendon in the back of the leg just above the foot. It attaches the large muscles of the lower leg to the heel bone (called calcaneus).  This diagnosis (learning what is wrong) is made by examination. X-rays will be generally be normal if only tendonitis is present. HOME CARE INSTRUCTIONS  Apply ice to the injury for 20 minutes 2 times per day. Put the ice in a plastic bag and place a towel between the bag of ice and your skin.   Try to avoid use other than gentle range of motion while the tendon is painful. Do not resume use until instructed by your caregiver. Then begin use gradually. Do not increase use to the point of pain. If pain does develop, decrease use and continue the above measures. Gradually increase activities that do not cause discomfort until you gradually achieve normal use.   Only take over-the-counter or prescription medicines for pain, discomfort, or fever as directed by your caregiver.  SEEK MEDICAL CARE IF:  Your pain and swelling increase or pain is uncontrolled with medications.   You develop new, unexplained problems (symptoms) or an increase of the symptoms that brought you to your caregiver.   You develop an inability to move your toes or foot, develop warmth and swelling in your foot, or begin running an unexplained temperature.  MAKE SURE YOU:   Understand these instructions.   Will watch your condition.   Will get help right away if you are not doing well or get worse.  Document Released: 08/13/2005 Document Re-Released: 01/30/2009 [facility] Patient  Information 2011 [place], [place].

## 2011-06-27 ENCOUNTER — Other Ambulatory Visit: Payer: Self-pay | Admitting: Internal Medicine

## 2011-06-27 DIAGNOSIS — S99912A Unspecified injury of left ankle, initial encounter: Secondary | ICD-10-CM

## 2011-06-27 DIAGNOSIS — M7660 Achilles tendinitis, unspecified leg: Secondary | ICD-10-CM

## 2011-07-24 ENCOUNTER — Telehealth: Payer: Self-pay

## 2011-07-24 MED ORDER — TESTOSTERONE CYPIONATE 200 MG/ML IM SOLN: 200.0000 mg | INTRAMUSCULAR | Status: DC

## 2011-07-24 NOTE — Telephone Encounter (Signed)
Rx sent to pharmacy   

## 2011-07-24 NOTE — Telephone Encounter (Signed)
[facility], please advise if ok

## 2011-07-24 NOTE — Telephone Encounter (Signed)
yes

## 2011-08-01 ENCOUNTER — Ambulatory Visit: Payer: PRIVATE HEALTH INSURANCE | Admitting: Internal Medicine

## 2011-08-08 LAB — BASIC METABOLIC PANEL
BUN: 11
BUN: 16
CO2: 23
CO2: 27
CO2: 29
CO2: 30
Calcium: 9.2
Chloride: 102
Chloride: 105
Creatinine, Ser: 1.04
GFR calc Af Amer: >60
GFR calc non Af Amer: >60
Glucose, Bld: 114 — ABNORMAL HIGH
Glucose, Bld: 118 — ABNORMAL HIGH
Glucose, Bld: 97
Potassium: 3.5
Potassium: 4
Potassium: 4.2
Potassium: 4.9
Sodium: 135
Sodium: 139

## 2011-08-08 LAB — URINALYSIS, ROUTINE W REFLEX MICROSCOPIC
Bilirubin Urine: NEGATIVE
Ketones, ur: NEGATIVE
Nitrite: NEGATIVE
Urobilinogen, UA: 0.2

## 2011-08-08 LAB — PROTIME-INR
INR: 0.9
Prothrombin Time: 12.1

## 2011-08-08 LAB — CBC
HCT: 36.6 — ABNORMAL LOW
HCT: 36.9 — ABNORMAL LOW
HCT: 38.7 — ABNORMAL LOW
Hemoglobin: 12.6 — ABNORMAL LOW
Hemoglobin: 12.8 — ABNORMAL LOW
Hemoglobin: 13.4
MCHC: 34.3
MCV: 90.8
MCV: 91.3
Platelets: 234
Platelets: 269
RBC: 4.01 — ABNORMAL LOW
RDW: 13.8
RDW: 13.9
RDW: 13.9

## 2011-08-08 LAB — APTT: aPTT: 28

## 2011-08-08 LAB — TYPE AND SCREEN

## 2011-10-31 ENCOUNTER — Telehealth: Payer: Self-pay

## 2011-10-31 DIAGNOSIS — M545 Low back pain: Secondary | ICD-10-CM

## 2011-10-31 MED ORDER — DIAZEPAM 10 MG PO TABS: 10.0000 mg | ORAL_TABLET | Freq: Three times a day (TID) | ORAL | Status: DC | PRN

## 2011-10-31 NOTE — Telephone Encounter (Signed)
yes

## 2011-10-31 NOTE — Telephone Encounter (Signed)
Rx called in to [facility] per pt

## 2011-10-31 NOTE — Telephone Encounter (Signed)
Please advise if ok to refill diazepam for pt Thanks

## 2011-12-09 ENCOUNTER — Telehealth: Payer: Self-pay

## 2011-12-09 NOTE — Telephone Encounter (Signed)
Received fax from pharmacy stating that per pt "testosterone enan has stopped working since the only product available is from [facility]". He wants to try testosterone cypionate (10cc vial), please advise Thanks

## 2011-12-09 NOTE — Telephone Encounter (Signed)
He needs to be seen

## 2012-01-15 ENCOUNTER — Encounter: Payer: Self-pay | Admitting: Internal Medicine

## 2012-01-15 ENCOUNTER — Ambulatory Visit (INDEPENDENT_AMBULATORY_CARE_PROVIDER_SITE_OTHER): Payer: BC Managed Care – PPO | Admitting: Internal Medicine

## 2012-01-15 ENCOUNTER — Telehealth: Payer: Self-pay

## 2012-01-15 ENCOUNTER — Other Ambulatory Visit (INDEPENDENT_AMBULATORY_CARE_PROVIDER_SITE_OTHER): Payer: BC Managed Care – PPO

## 2012-01-15 VITALS — BP 102/80 | HR 64 | Temp 97.5°F | Resp 18 | Wt 197.2 lb

## 2012-01-15 DIAGNOSIS — Z136 Encounter for screening for cardiovascular disorders: Secondary | ICD-10-CM

## 2012-01-15 DIAGNOSIS — M545 Low back pain: Secondary | ICD-10-CM

## 2012-01-15 DIAGNOSIS — Z0000 Encounter for general adult medical examination without abnormal findings: Secondary | ICD-10-CM | POA: Insufficient documentation

## 2012-01-15 DIAGNOSIS — E291 Testicular hypofunction: Secondary | ICD-10-CM

## 2012-01-15 LAB — LDL CHOLESTEROL, DIRECT: Direct LDL: 194.5 mg/dL

## 2012-01-15 LAB — COMPREHENSIVE METABOLIC PANEL
ALT: 8 U/L (ref 0–53)
AST: 15 U/L (ref 0–37)
Albumin: 3.7 g/dL (ref 3.5–5.2)
CO2: 30 mEq/L (ref 19–32)
Calcium: 8.9 mg/dL (ref 8.4–10.5)
Chloride: 102 mEq/L (ref 96–112)
Creatinine, Ser: 1.1 mg/dL (ref 0.4–1.5)
GFR: 72.95 mL/min (ref >60.00)
Potassium: 4.5 mEq/L (ref 3.5–5.1)

## 2012-01-15 LAB — CBC WITH DIFFERENTIAL/PLATELET
Basophils Absolute: 0 10*3/uL (ref 0.0–0.1)
Eosinophils Absolute: 0 10*3/uL (ref 0.0–0.7)
Hemoglobin: 18 g/dL (ref 13.0–17.0)
Lymphocytes Relative: 23.2 % (ref 12.0–46.0)
MCHC: 33.2 g/dL (ref 30.0–36.0)
Monocytes Relative: 7.6 % (ref 3.0–12.0)
Neutrophils Relative %: 68.2 % (ref 43.0–77.0)
Platelets: 249 10*3/uL (ref 150.0–400.0)
RDW: 16.1 % — ABNORMAL HIGH (ref 11.5–14.6)

## 2012-01-15 LAB — URINALYSIS, ROUTINE W REFLEX MICROSCOPIC
Ketones, ur: NEGATIVE
Nitrite: NEGATIVE
Specific Gravity, Urine: >=1.03 (ref 1.000–1.030)
Total Protein, Urine: NEGATIVE
pH: 6 (ref 5.0–8.0)

## 2012-01-15 LAB — PSA: PSA: 0.98 ng/mL (ref 0.10–4.00)

## 2012-01-15 LAB — LIPID PANEL
Cholesterol: 245 mg/dL — ABNORMAL HIGH (ref 0–200)
Total CHOL/HDL Ratio: 7
VLDL: 27 mg/dL (ref 0.0–40.0)

## 2012-01-15 MED ORDER — OXYCODONE HCL 10 MG PO TABS: 10.0000 mg | ORAL_TABLET | Freq: Two times a day (BID) | ORAL | Status: DC | PRN

## 2012-01-15 MED ORDER — TESTOSTERONE CYPIONATE 200 MG/ML IM SOLN: 200.0000 mg | INTRAMUSCULAR | Status: DC

## 2012-01-15 MED ORDER — "SYRINGE 25G X 1"" 3 ML MISC": Status: DC

## 2012-01-15 MED ORDER — OXYCODONE HCL 10 MG PO TB12: 10.0000 mg | ORAL_TABLET | Freq: Two times a day (BID) | ORAL | Status: DC | PRN

## 2012-01-15 NOTE — Telephone Encounter (Signed)
Pharmacy called stating that with insurance pt cannot afford the oxycontin 10mg /12hr. Patient is requesting that rx is switched to oxycondone 10 mg immediate-release tabs. Please advise if ok and clarify direction please. Thanks

## 2012-01-15 NOTE — Telephone Encounter (Signed)
done

## 2012-01-15 NOTE — Assessment & Plan Note (Addendum)
Exam done, labs ordered, vaccines updated, his EKG is normal, he was referred for a colonoscopy

## 2012-01-15 NOTE — Patient Instructions (Signed)
Health Maintenance, Males A healthy lifestyle and preventative care can promote health and wellness.  Maintain regular health, dental, and eye exams.   Eat a healthy diet. Foods like vegetables, fruits, whole grains, low-fat dairy products, and lean protein foods contain the nutrients you need without too many calories. Decrease your intake of foods high in solid fats, added sugars, and salt. Get information about a proper diet from your caregiver, if necessary.   Regular physical exercise is one of the most important things you can do for your health. Most adults should get at least 150 minutes of moderate-intensity exercise (any activity that increases your heart rate and causes you to sweat) each week. In addition, most adults need muscle-strengthening exercises on 2 or more days a week.    Maintain a healthy weight. The body mass index (BMI) is a screening tool to identify possible weight problems. It provides an estimate of body fat based on height and weight. Your caregiver can help determine your BMI, and can help you achieve or maintain a healthy weight. For adults 20 years and older:   A BMI below 18.5 is considered underweight.   A BMI of 18.5 to 24.9 is normal.   A BMI of 25 to 29.9 is considered overweight.   A BMI of 30 and above is considered obese.   Maintain normal blood lipids and cholesterol by exercising and minimizing your intake of saturated fat. Eat a balanced diet with plenty of fruits and vegetables. Blood tests for lipids and cholesterol should begin at age 20 and be repeated every 5 years. If your lipid or cholesterol levels are high, you are over 50, or you are a high risk for heart disease, you may need your cholesterol levels checked more frequently.Ongoing high lipid and cholesterol levels should be treated with medicines, if diet and exercise are not effective.   If you smoke, find out from your caregiver how to quit. If you do not use tobacco, do not start.    If you choose to drink alcohol, do not exceed 2 drinks per day. One drink is considered to be 12 ounces (355 mL) of beer, 5 ounces (148 mL) of wine, or 1.5 ounces (44 mL) of liquor.   Avoid use of street drugs. Do not share needles with anyone. Ask for help if you need support or instructions about stopping the use of drugs.   High blood pressure causes heart disease and increases the risk of stroke. Blood pressure should be checked at least every 1 to 2 years. Ongoing high blood pressure should be treated with medicines if weight loss and exercise are not effective.   If you are 45 to 62 years old, ask your caregiver if you should take aspirin to prevent heart disease.   Diabetes screening involves taking a blood sample to check your fasting blood sugar level. This should be done once every 3 years, after age 45, if you are within normal weight and without risk factors for diabetes. Testing should be considered at a younger age or be carried out more frequently if you are overweight and have at least 1 risk factor for diabetes.   Colorectal cancer can be detected and often prevented. Most routine colorectal cancer screening begins at the age of 50 and continues through age 75. However, your caregiver may recommend screening at an earlier age if you have risk factors for colon cancer. On a yearly basis, your caregiver may provide home test kits to check for hidden   blood in the stool. Use of a small camera at the end of a tube, to directly examine the colon (sigmoidoscopy or colonoscopy), can detect the earliest forms of colorectal cancer. Talk to your caregiver about this at age 50, when routine screening begins. Direct examination of the colon should be repeated every 5 to 10 years through age 75, unless early forms of pre-cancerous polyps or small growths are found.   Healthy men should no longer receive prostate-specific antigen (PSA) blood tests as part of routine cancer screening. Consult with  your caregiver about prostate cancer screening.   Practice safe sex. Use condoms and avoid high-risk sexual practices to reduce the spread of sexually transmitted infections (STIs).   Use sunscreen with a sun protection factor (SPF) of 30 or greater. Apply sunscreen liberally and repeatedly throughout the day. You should seek shade when your shadow is shorter than you. Protect yourself by wearing long sleeves, pants, a wide-brimmed hat, and sunglasses year round, whenever you are outdoors.   Notify your caregiver of new moles or changes in moles, especially if there is a change in shape or color. Also notify your caregiver if a mole is larger than the size of a pencil eraser.   A one-time screening for abdominal aortic aneurysm (AAA) and surgical repair of large AAAs by sound wave imaging (ultrasonography) is recommended for ages 65 to 75 years who are current or former smokers.   Stay current with your immunizations.  Document Released: 05/01/2008 Document Revised: 07/16/2011 Document Reviewed: 03/31/2011 [employer] Patient Information 2012 [employer]. 

## 2012-01-15 NOTE — Telephone Encounter (Signed)
Pharmacy notified via phone call to [facility]

## 2012-01-15 NOTE — Progress Notes (Signed)
  Subjective:    Patient ID: [name] Haney, male    DOB: December 02, 1950, 61 y.o.   MRN: [id number]  HPI He returns for a complete physical and he offers no complaints.   Review of Systems  Constitutional: Negative for fever, chills, diaphoresis, activity change, appetite change, fatigue and unexpected weight change.  HENT: Negative.   Eyes: Negative.   Respiratory: Negative for apnea, cough, choking, chest tightness, shortness of breath, wheezing and stridor.   Cardiovascular: Negative for chest pain, palpitations and leg swelling.  Gastrointestinal: Negative for nausea, vomiting, abdominal pain, diarrhea, constipation, blood in stool, abdominal distention, anal bleeding and rectal pain.  Genitourinary: Negative.   Musculoskeletal: Positive for back pain (chronic, unchanged) and arthralgias (chronic, unchanged). Negative for myalgias, joint swelling and gait problem.  Skin: Negative for color change, pallor, rash and wound.  Neurological: Negative for dizziness, tremors, seizures, syncope, facial asymmetry, speech difficulty, weakness, light-headedness, numbness and headaches.  Hematological: Negative for adenopathy. Does not bruise/bleed easily.  Psychiatric/Behavioral: Negative.        Objective:   Physical Exam  Vitals reviewed. Constitutional: He is oriented to person, place, and time. He appears well-developed and well-nourished. No distress.  HENT:  Head: Normocephalic and atraumatic.  Mouth/Throat: Oropharynx is clear and moist. No oropharyngeal exudate.  Eyes: Conjunctivae are normal. Right eye exhibits no discharge. Left eye exhibits no discharge. No scleral icterus.  Neck: Normal range of motion. Neck supple. No JVD present. No tracheal deviation present. No thyromegaly present.  Cardiovascular: Normal rate, regular rhythm, normal heart sounds and intact distal pulses.  Exam reveals no gallop and no friction rub.   No murmur heard. Pulmonary/Chest: Effort normal and breath  sounds normal. No stridor. No respiratory distress. He has no wheezes. He has no rales. He exhibits no tenderness.  Abdominal: Soft. Bowel sounds are normal. He exhibits no distension. There is no tenderness. There is no rebound and no guarding. Hernia confirmed negative in the right inguinal area and confirmed negative in the left inguinal area.  Genitourinary: Prostate normal, testes normal and penis normal. Rectal exam shows no external hemorrhoid, no internal hemorrhoid, no fissure, no mass, no tenderness and anal tone normal. Guaiac positive stool. Prostate is not enlarged and not tender. Right testis shows no mass, no swelling and no tenderness. Right testis is descended. Left testis shows no mass, no swelling and no tenderness. Left testis is descended. Circumcised. No penile tenderness. No discharge found.  Musculoskeletal: Normal range of motion. He exhibits no edema and no tenderness.  Lymphadenopathy:    He has no cervical adenopathy.       Right: No inguinal adenopathy present.       Left: No inguinal adenopathy present.  Neurological: He is oriented to person, place, and time.  Skin: Skin is warm and dry. No rash noted. He is not diaphoretic. No erythema. No pallor.  Psychiatric: He has a normal mood and affect. His behavior is normal. Judgment and thought content normal.          Assessment & Plan:

## 2012-01-23 ENCOUNTER — Telehealth: Payer: Self-pay | Admitting: *Deleted

## 2012-01-23 NOTE — Telephone Encounter (Signed)
Request status of PA for Testosterone.

## 2012-01-26 ENCOUNTER — Telehealth: Payer: Self-pay | Admitting: *Deleted

## 2012-01-26 NOTE — Telephone Encounter (Signed)
Form received from [employer], completed and faxed back today to try and get approval

## 2012-01-26 NOTE — Telephone Encounter (Signed)
Some information is missing from the PA form that was faxed.  The fax # is missing. It was not dated.  DOB was missing.

## 2012-01-28 NOTE — Telephone Encounter (Signed)
Per fax, PA approved through 10/21/2014

## 2012-03-22 ENCOUNTER — Encounter: Payer: Self-pay | Admitting: Gastroenterology

## 2012-04-19 ENCOUNTER — Telehealth: Payer: Self-pay

## 2012-04-19 NOTE — Telephone Encounter (Signed)
Pharmacy notified.

## 2012-04-19 NOTE — Telephone Encounter (Signed)
Please advise if ok to diazepam 10mg . Patient last seen 01/05/12 and med last refilled 10/31/11 #[id number]. Thanks

## 2012-04-19 NOTE — Telephone Encounter (Signed)
He needs to be seen

## 2012-06-02 ENCOUNTER — Encounter: Payer: Self-pay | Admitting: Internal Medicine

## 2012-06-02 ENCOUNTER — Ambulatory Visit (INDEPENDENT_AMBULATORY_CARE_PROVIDER_SITE_OTHER): Payer: BC Managed Care – PPO | Admitting: Internal Medicine

## 2012-06-02 VITALS — BP 120/84 | HR 58 | Temp 98.1°F | Resp 16 | Wt 193.5 lb

## 2012-06-02 DIAGNOSIS — M545 Low back pain: Secondary | ICD-10-CM

## 2012-06-02 DIAGNOSIS — E7800 Pure hypercholesterolemia, unspecified: Secondary | ICD-10-CM

## 2012-06-02 DIAGNOSIS — E039 Hypothyroidism, unspecified: Secondary | ICD-10-CM

## 2012-06-02 DIAGNOSIS — N62 Hypertrophy of breast: Secondary | ICD-10-CM | POA: Insufficient documentation

## 2012-06-02 DIAGNOSIS — E291 Testicular hypofunction: Secondary | ICD-10-CM

## 2012-06-02 DIAGNOSIS — N644 Mastodynia: Secondary | ICD-10-CM | POA: Insufficient documentation

## 2012-06-02 DIAGNOSIS — N529 Male erectile dysfunction, unspecified: Secondary | ICD-10-CM

## 2012-06-02 DIAGNOSIS — M1990 Unspecified osteoarthritis, unspecified site: Secondary | ICD-10-CM

## 2012-06-02 MED ORDER — TESTOSTERONE CYPIONATE 200 MG/ML IM SOLN: 200.0000 mg | INTRAMUSCULAR | Status: DC

## 2012-06-02 MED ORDER — OXYCODONE HCL 10 MG PO TABS: 10.0000 mg | ORAL_TABLET | Freq: Three times a day (TID) | ORAL | Status: DC | PRN

## 2012-06-02 MED ORDER — DIAZEPAM 10 MG PO TABS: 10.0000 mg | ORAL_TABLET | Freq: Three times a day (TID) | ORAL | Status: DC | PRN

## 2012-06-02 NOTE — Assessment & Plan Note (Signed)
I have asked him to have a mammogram done to look for breast cancer

## 2012-06-02 NOTE — Patient Instructions (Signed)
Testosterone injection What is this medicine? TESTOSTERONE (tes TOS ter one) is the main male hormone. It supports normal male development such as muscle growth, facial hair, and deep voice. It is used in males to treat low testosterone levels. This medicine may be used for other purposes; ask your health care provider or pharmacist if you have questions. What should I tell my health care provider before I take this medicine? They need to know if you have any of these conditions: -breast cancer -diabetes -heart disease -kidney disease -liver disease -lung disease -prostate cancer, enlargement -an unusual or allergic reaction to testosterone, other medicines, foods, dyes, or preservatives -pregnant or trying to get pregnant -breast-feeding How should I use this medicine? This medicine is for injection into a muscle. It is usually given by a health care professional in a hospital or clinic setting. Contact your pediatrician regarding the use of this medicine in children. While this medicine may be prescribed for children as young as 12 years of age for selected conditions, precautions do apply. Overdosage: If you think you have taken too much of this medicine contact a poison control center or emergency room at once. NOTE: This medicine is only for you. Do not share this medicine with others. What if I miss a dose? Try not to miss a dose. Your doctor or health care professional will tell you when your next injection is due. Notify the office if you are unable to keep an appointment. What may interact with this medicine? -medicines for diabetes -medicines that treat or prevent blood clots like warfarin -oxyphenbutazone -propranolol -steroid medicines like prednisone or cortisone This list may not describe all possible interactions. Give your health care provider a list of all the medicines, herbs, non-prescription drugs, or dietary supplements you use. Also tell them if you smoke, drink  alcohol, or use illegal drugs. Some items may interact with your medicine. What should I watch for while using this medicine? Visit your doctor or health care professional for regular checks on your progress. They will need to check the level of testosterone in your blood. This medicine may affect blood sugar levels. If you have diabetes, check with your doctor or health care professional before you change your diet or the dose of your diabetic medicine. This drug is banned from use in athletes by most athletic organizations. What side effects may I notice from receiving this medicine? Side effects that you should report to your doctor or health care professional as soon as possible: -allergic reactions like skin rash, itching or hives, swelling of the face, lips, or tongue -breast enlargement -breathing problems -changes in mood, especially anger, depression, or rage -dark urine -general ill feeling or flu-like symptoms -light-colored stools -loss of appetite, nausea -nausea, vomiting -right upper belly pain -stomach pain -swelling of ankles -too frequent or persistent erections -trouble passing urine or change in the amount of urine -unusually weak or tired -yellowing of the eyes or skin Additional side effects that can occur in women include: -deep or hoarse voice -facial hair growth -irregular menstrual periods Side effects that usually do not require medical attention (report to your doctor or health care professional if they continue or are bothersome): -acne -change in sex drive or performance -hair loss -headache This list may not describe all possible side effects. Call your doctor for medical advice about side effects. You may report side effects to FDA at 1-800-FDA-1088. Where should I keep my medicine? Keep out of the reach of children. This medicine   can be abused. Keep your medicine in a safe place to protect it from theft. Do not share this medicine with anyone.  Selling or giving away this medicine is dangerous and against the law. Store at room temperature between 20 and 25 degrees C (68 and 77 degrees F). Do not freeze. Protect from light. Follow the directions for the product you are prescribed. Throw away any unused medicine after the expiration date. NOTE: This sheet is a summary. It may not cover all possible information. If you have questions about this medicine, talk to your doctor, pharmacist, or health care provider.  2012, [employer]. (01/14/2008 4:13:46 PM) 

## 2012-06-02 NOTE — Assessment & Plan Note (Addendum)
He refuses to take a statin or any other type of cholesterol lowering medication, I will recheck his FLP today

## 2012-06-02 NOTE — Assessment & Plan Note (Signed)
I will recheck his T level today and will monitor for complications as well

## 2012-06-02 NOTE — Assessment & Plan Note (Signed)
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

## 2012-06-02 NOTE — Assessment & Plan Note (Signed)
Continue oxycodone as needed. ?

## 2012-06-02 NOTE — Assessment & Plan Note (Signed)
I will check his TSH level today 

## 2012-06-02 NOTE — Progress Notes (Signed)
Subjective:    Patient ID: [name] Haney, male    DOB: 20-Jan-1951, 61 y.o.   MRN: [id number]  Back Pain This is a chronic problem. The current episode started more than 1 year ago. The problem occurs intermittently. The problem is unchanged. The pain is present in the lumbar spine. The quality of the pain is described as aching (spasms). The pain does not radiate. The pain is at a severity of 6/10. The pain is moderate. The pain is worse during the day. The symptoms are aggravated by standing and bending. Pertinent negatives include no abdominal pain, bladder incontinence, bowel incontinence, chest pain, dysuria, fever, headaches, leg pain, numbness, paresis, paresthesias, pelvic pain, perianal numbness, tingling, weakness or weight loss. Risk factors include obesity and lack of exercise. Treatments tried: valium and oxycodone. The treatment provided significant relief.      Review of Systems  Constitutional: Negative for fever, chills, weight loss, diaphoresis, activity change, appetite change, fatigue and unexpected weight change.  HENT: Positive for neck pain (chronic, unchanged). Negative for facial swelling and neck stiffness.   Eyes: Negative.   Respiratory: Negative for cough, chest tightness, shortness of breath, wheezing and stridor.   Cardiovascular: Negative for chest pain, palpitations and leg swelling.  Gastrointestinal: Negative for nausea, vomiting, abdominal pain, diarrhea, constipation, abdominal distention and bowel incontinence.  Genitourinary: Negative for bladder incontinence, dysuria, urgency, frequency, hematuria, flank pain, decreased urine volume, discharge, penile swelling, scrotal swelling, enuresis, difficulty urinating, genital sores, penile pain, testicular pain and pelvic pain.  Musculoskeletal: Positive for back pain and arthralgias (knees). Negative for myalgias, joint swelling and gait problem.  Skin: Negative.   Neurological: Negative for dizziness, tingling,  tremors, seizures, syncope, facial asymmetry, speech difficulty, weakness, light-headedness, numbness, headaches and paresthesias.  Hematological: Negative for adenopathy. Does not bruise/bleed easily.  Psychiatric/Behavioral: Negative.        Objective:   Physical Exam  Vitals reviewed. Constitutional: He is oriented to person, place, and time. He appears well-developed and well-nourished. No distress.  HENT:  Head: Normocephalic and atraumatic.  Mouth/Throat: Oropharynx is clear and moist. No oropharyngeal exudate.  Eyes: Conjunctivae are normal. Right eye exhibits no discharge. Left eye exhibits no discharge. No scleral icterus.  Neck: Normal range of motion. Neck supple. No JVD present. No tracheal deviation present. No thyromegaly present.  Cardiovascular: Normal rate, regular rhythm, normal heart sounds and intact distal pulses.  Exam reveals no gallop and no friction rub.   No murmur heard. Pulmonary/Chest: Effort normal and breath sounds normal. No stridor. No respiratory distress. He has no wheezes. He has no rales. Chest wall is not dull to percussion. He exhibits deformity. He exhibits no mass, no tenderness, no bony tenderness, no laceration, no crepitus, no edema, no swelling and no retraction. Right breast exhibits no inverted nipple, no mass, no nipple discharge, no skin change and no tenderness. Left breast exhibits no inverted nipple, no mass, no nipple discharge, no skin change and no tenderness. Breasts are symmetrical.       He has bilateral symmetrical gynecomastia, he reports painful lumps in his breasts but I don't feel any masses or lumps today  Abdominal: Soft. Bowel sounds are normal. He exhibits no distension and no mass. There is no tenderness. There is no rebound and no guarding.  Musculoskeletal: Normal range of motion. He exhibits no edema and no tenderness.  Lymphadenopathy:    He has no cervical adenopathy.  Neurological: He is oriented to person, place, and  time.  Skin: Skin  is warm and dry. No rash noted. He is not diaphoretic. No erythema. No pallor.  Psychiatric: He has a normal mood and affect. His behavior is normal. Judgment and thought content normal.      Lab Results  Component Value Date   WBC 9.4 01/15/2012   HGB 18.0* 01/15/2012   HCT 54.0* 01/15/2012   PLT 249.0 01/15/2012   GLUCOSE 77 01/15/2012   CHOL 245* 01/15/2012   TRIG 135.0 01/15/2012   HDL 35.80* 01/15/2012   LDLDIRECT 194.5 01/15/2012   ALT 8 01/15/2012   AST 15 01/15/2012   NA 138 01/15/2012   K 4.5 01/15/2012   CL 102 01/15/2012   CREATININE 1.1 01/15/2012   BUN 11 01/15/2012   CO2 30 01/15/2012   TSH 4.66 01/15/2012   PSA 0.98 01/15/2012   INR 2.2* 01/15/2008   HGBA1C 5.6 04/18/2011      Assessment & Plan:

## 2012-06-02 NOTE — Assessment & Plan Note (Signed)
Will continue current regimen for pain management

## 2012-10-19 ENCOUNTER — Other Ambulatory Visit: Payer: Self-pay

## 2012-10-19 DIAGNOSIS — E291 Testicular hypofunction: Secondary | ICD-10-CM

## 2012-10-19 MED ORDER — TESTOSTERONE CYPIONATE 200 MG/ML IM SOLN: 200.0000 mg | INTRAMUSCULAR | Status: DC

## 2012-11-04 ENCOUNTER — Telehealth: Payer: Self-pay

## 2012-11-04 NOTE — Telephone Encounter (Signed)
Received faxed RX for diazepam 10 mg tid. RX last filled 06/02/12 #[id number], please advise Thanks

## 2012-11-04 NOTE — Telephone Encounter (Signed)
He needs to be seen

## 2012-11-04 NOTE — Telephone Encounter (Signed)
Pharmacy notified.

## 2013-02-25 ENCOUNTER — Ambulatory Visit (INDEPENDENT_AMBULATORY_CARE_PROVIDER_SITE_OTHER): Payer: BC Managed Care – PPO

## 2013-02-25 ENCOUNTER — Ambulatory Visit (INDEPENDENT_AMBULATORY_CARE_PROVIDER_SITE_OTHER): Payer: BC Managed Care – PPO | Admitting: Internal Medicine

## 2013-02-25 ENCOUNTER — Encounter: Payer: Self-pay | Admitting: Internal Medicine

## 2013-02-25 VITALS — BP 120/84 | HR 83 | Temp 98.4°F | Resp 16 | Wt 203.0 lb

## 2013-02-25 DIAGNOSIS — E039 Hypothyroidism, unspecified: Secondary | ICD-10-CM

## 2013-02-25 DIAGNOSIS — G894 Chronic pain syndrome: Secondary | ICD-10-CM

## 2013-02-25 DIAGNOSIS — Z0000 Encounter for general adult medical examination without abnormal findings: Secondary | ICD-10-CM

## 2013-02-25 DIAGNOSIS — E7800 Pure hypercholesterolemia, unspecified: Secondary | ICD-10-CM

## 2013-02-25 DIAGNOSIS — Z860100 Personal history of colon polyps, unspecified: Secondary | ICD-10-CM | POA: Insufficient documentation

## 2013-02-25 DIAGNOSIS — M545 Low back pain: Secondary | ICD-10-CM

## 2013-02-25 DIAGNOSIS — N400 Enlarged prostate without lower urinary tract symptoms: Secondary | ICD-10-CM

## 2013-02-25 DIAGNOSIS — M1990 Unspecified osteoarthritis, unspecified site: Secondary | ICD-10-CM

## 2013-02-25 LAB — URINALYSIS, ROUTINE W REFLEX MICROSCOPIC
Bilirubin Urine: NEGATIVE
Total Protein, Urine: NEGATIVE
Urine Glucose: NEGATIVE

## 2013-02-25 LAB — COMPREHENSIVE METABOLIC PANEL
AST: 25 U/L (ref 0–37)
Albumin: 3.7 g/dL (ref 3.5–5.2)
Alkaline Phosphatase: 56 U/L (ref 39–117)
BUN: 9 mg/dL (ref 6–23)
Potassium: 4.1 mEq/L (ref 3.5–5.1)
Sodium: 135 mEq/L (ref 135–145)

## 2013-02-25 LAB — CBC WITH DIFFERENTIAL/PLATELET
Basophils Relative: 0.8 % (ref 0.0–3.0)
Eosinophils Absolute: 0.1 10*3/uL (ref 0.0–0.7)
MCHC: 34.2 g/dL (ref 30.0–36.0)
MCV: 93.5 fl (ref 78.0–100.0)
Monocytes Absolute: 1.2 10*3/uL — ABNORMAL HIGH (ref 0.1–1.0)
Neutrophils Relative %: 71.5 % (ref 43.0–77.0)
Platelets: 269 10*3/uL (ref 150.0–400.0)

## 2013-02-25 LAB — LIPID PANEL
Cholesterol: 213 mg/dL — ABNORMAL HIGH (ref 0–200)
HDL: 34.4 mg/dL — ABNORMAL LOW (ref >39.00)
Triglycerides: 200 mg/dL — ABNORMAL HIGH (ref 0.0–149.0)

## 2013-02-25 LAB — FECAL OCCULT BLOOD, GUAIAC: Fecal Occult Blood: NEGATIVE

## 2013-02-25 LAB — PSA: PSA: 1.35 ng/mL (ref 0.10–4.00)

## 2013-02-25 LAB — TSH: TSH: 7.99 u[IU]/mL — ABNORMAL HIGH (ref 0.35–5.50)

## 2013-02-25 MED ORDER — ROSUVASTATIN CALCIUM 20 MG PO TABS: 20.0000 mg | ORAL_TABLET | Freq: Every day | ORAL | Status: DC

## 2013-02-25 MED ORDER — DIAZEPAM 10 MG PO TABS: 10.0000 mg | ORAL_TABLET | Freq: Three times a day (TID) | ORAL | Status: DC | PRN

## 2013-02-25 MED ORDER — OXYCODONE HCL 10 MG PO TABS: 10.0000 mg | ORAL_TABLET | Freq: Three times a day (TID) | ORAL | Status: DC | PRN

## 2013-02-25 NOTE — Patient Instructions (Signed)
Health Maintenance, Males A healthy lifestyle and preventative care can promote health and wellness.  Maintain regular health, dental, and eye exams.  Eat a healthy diet. Foods like vegetables, fruits, whole grains, low-fat dairy products, and lean protein foods contain the nutrients you need without too many calories. Decrease your intake of foods high in solid fats, added sugars, and salt. Get information about a proper diet from your caregiver, if necessary.  Regular physical exercise is one of the most important things you can do for your health. Most adults should get at least 150 minutes of moderate-intensity exercise (any activity that increases your heart rate and causes you to sweat) each week. In addition, most adults need muscle-strengthening exercises on 2 or more days a week.   Maintain a healthy weight. The body mass index (BMI) is a screening tool to identify possible weight problems. It provides an estimate of body fat based on height and weight. Your caregiver can help determine your BMI, and can help you achieve or maintain a healthy weight. For adults 20 years and older:  A BMI below 18.5 is considered underweight.  A BMI of 18.5 to 24.9 is normal.  A BMI of 25 to 29.9 is considered overweight.  A BMI of 30 and above is considered obese.  Maintain normal blood lipids and cholesterol by exercising and minimizing your intake of saturated fat. Eat a balanced diet with plenty of fruits and vegetables. Blood tests for lipids and cholesterol should begin at age 20 and be repeated every 5 years. If your lipid or cholesterol levels are high, you are over 50, or you are a high risk for heart disease, you may need your cholesterol levels checked more frequently.Ongoing high lipid and cholesterol levels should be treated with medicines, if diet and exercise are not effective.  If you smoke, find out from your caregiver how to quit. If you do not use tobacco, do not start.  If you  choose to drink alcohol, do not exceed 2 drinks per day. One drink is considered to be 12 ounces (355 mL) of beer, 5 ounces (148 mL) of wine, or 1.5 ounces (44 mL) of liquor.  Avoid use of street drugs. Do not share needles with anyone. Ask for help if you need support or instructions about stopping the use of drugs.  High blood pressure causes heart disease and increases the risk of stroke. Blood pressure should be checked at least every 1 to 2 years. Ongoing high blood pressure should be treated with medicines if weight loss and exercise are not effective.  If you are 45 to 63 years old, ask your caregiver if you should take aspirin to prevent heart disease.  Diabetes screening involves taking a blood sample to check your fasting blood sugar level. This should be done once every 3 years, after age 45, if you are within normal weight and without risk factors for diabetes. Testing should be considered at a younger age or be carried out more frequently if you are overweight and have at least 1 risk factor for diabetes.  Colorectal cancer can be detected and often prevented. Most routine colorectal cancer screening begins at the age of 50 and continues through age 75. However, your caregiver may recommend screening at an earlier age if you have risk factors for colon cancer. On a yearly basis, your caregiver may provide home test kits to check for hidden blood in the stool. Use of a small camera at the end of a tube,   to directly examine the colon (sigmoidoscopy or colonoscopy), can detect the earliest forms of colorectal cancer. Talk to your caregiver about this at age 50, when routine screening begins. Direct examination of the colon should be repeated every 5 to 10 years through age 75, unless early forms of pre-cancerous polyps or small growths are found.  Hepatitis C blood testing is recommended for all people born from 1945 through 1965 and any individual with known risks for hepatitis C.  Healthy  men should no longer receive prostate-specific antigen (PSA) blood tests as part of routine cancer screening. Consult with your caregiver about prostate cancer screening.  Testicular cancer screening is not recommended for adolescents or adult males who have no symptoms. Screening includes self-exam, caregiver exam, and other screening tests. Consult with your caregiver about any symptoms you have or any concerns you have about testicular cancer.  Practice safe sex. Use condoms and avoid high-risk sexual practices to reduce the spread of sexually transmitted infections (STIs).  Use sunscreen with a sun protection factor (SPF) of 30 or greater. Apply sunscreen liberally and repeatedly throughout the day. You should seek shade when your shadow is shorter than you. Protect yourself by wearing long sleeves, pants, a wide-brimmed hat, and sunglasses year round, whenever you are outdoors.  Notify your caregiver of new moles or changes in moles, especially if there is a change in shape or color. Also notify your caregiver if a mole is larger than the size of a pencil eraser.  A one-time screening for abdominal aortic aneurysm (AAA) and surgical repair of large AAAs by sound wave imaging (ultrasonography) is recommended for ages 65 to 75 years who are current or former smokers.  Stay current with your immunizations. Document Released: 05/01/2008 Document Revised: 01/26/2012 Document Reviewed: 03/31/2011 [employer] Patient Information 2013 [employer].  

## 2013-02-25 NOTE — Progress Notes (Signed)
Subjective:    Patient ID: [name] Haney, male    DOB: 20-May-1951, 62 y.o.   MRN: [id number]  Arthritis Presents for follow-up visit. He complains of pain. He reports no stiffness, joint swelling or joint warmth. The symptoms have been stable. Affected locations include the neck, left knee and right knee. His pain is at a severity of 5/10. Associated symptoms include pain at night and pain while resting. Pertinent negatives include no diarrhea, dry eyes, dry mouth, dysuria, fatigue, fever, rash, Raynaud's syndrome, uveitis or weight loss. Compliance with total regimen is 76-100%.      Review of Systems  Constitutional: Negative.  Negative for fever, chills, weight loss, diaphoresis, activity change, appetite change, fatigue and unexpected weight change.  HENT: Positive for neck pain.   Eyes: Negative.   Respiratory: Negative.   Cardiovascular: Negative.  Negative for chest pain, palpitations and leg swelling.  Gastrointestinal: Negative.  Negative for nausea, vomiting, abdominal pain, diarrhea, constipation, blood in stool, abdominal distention, anal bleeding and rectal pain.  Endocrine: Negative.   Genitourinary: Negative.  Negative for dysuria.  Musculoskeletal: Positive for back pain (aching and intermittent spasms), arthralgias and arthritis. Negative for myalgias, joint swelling, gait problem and stiffness.  Skin: Negative for color change, pallor, rash and wound.  Allergic/Immunologic: Negative.   Neurological: Negative.   Hematological: Negative.   Psychiatric/Behavioral: Negative.  Negative for suicidal ideas, hallucinations, behavioral problems, confusion, sleep disturbance, self-injury, dysphoric mood, decreased concentration and agitation. The patient is not nervous/anxious and is not hyperactive.        Objective:   Physical Exam  Vitals reviewed. Constitutional: He is oriented to person, place, and time. He appears well-developed and well-nourished. No distress.  HENT:   Head: Normocephalic and atraumatic.  Mouth/Throat: Oropharynx is clear and moist. No oropharyngeal exudate.  Eyes: Conjunctivae are normal. Right eye exhibits no discharge. Left eye exhibits no discharge. No scleral icterus.  Neck: Normal range of motion. Neck supple. No JVD present. No tracheal deviation present. No thyromegaly present.  Cardiovascular: Normal rate, regular rhythm, normal heart sounds and intact distal pulses.  Exam reveals no gallop and no friction rub.   No murmur heard. Pulmonary/Chest: Effort normal and breath sounds normal. No stridor. No respiratory distress. He has no wheezes. He has no rales. He exhibits no tenderness.  Abdominal: Soft. Bowel sounds are normal. He exhibits no distension and no mass. There is no tenderness. There is no rebound and no guarding. Hernia confirmed negative in the right inguinal area and confirmed negative in the left inguinal area.  Genitourinary: Rectum normal, prostate normal, testes normal and penis normal. Rectal exam shows no external hemorrhoid, no internal hemorrhoid, no fissure, no mass, no tenderness and anal tone normal. Guaiac negative stool. Prostate is not enlarged and not tender. Right testis shows no mass, no swelling and no tenderness. Right testis is descended. Left testis shows no mass, no swelling and no tenderness. Left testis is descended. Circumcised. No penile erythema or penile tenderness. No discharge found.  His testes are atrophied on both sides  Musculoskeletal: Normal range of motion. He exhibits no edema and no tenderness.  Lymphadenopathy:    He has no cervical adenopathy.       Right: No inguinal adenopathy present.       Left: No inguinal adenopathy present.  Neurological: He is alert and oriented to person, place, and time. He has normal reflexes. He displays normal reflexes. No cranial nerve deficit. He exhibits normal muscle tone. Coordination normal.  Skin: Skin is warm and dry. No rash noted. He is not  diaphoretic. No erythema. No pallor.  Psychiatric: He has a normal mood and affect. His behavior is normal. Judgment and thought content normal.     Lab Results  Component Value Date   WBC 9.4 01/15/2012   HGB 18.0* 01/15/2012   HCT 54.0* 01/15/2012   PLT 249.0 01/15/2012   GLUCOSE 77 01/15/2012   CHOL 245* 01/15/2012   TRIG 135.0 01/15/2012   HDL 35.80* 01/15/2012   LDLDIRECT 194.5 01/15/2012   ALT 8 01/15/2012   AST 15 01/15/2012   NA 138 01/15/2012   K 4.5 01/15/2012   CL 102 01/15/2012   CREATININE 1.1 01/15/2012   BUN 11 01/15/2012   CO2 30 01/15/2012   TSH 4.66 01/15/2012   PSA 0.98 01/15/2012   INR 2.2* 01/15/2008   HGBA1C 5.6 04/18/2011       Assessment & Plan:

## 2013-02-26 LAB — DRUGS OF ABUSE SCREEN W/O ALC, ROUTINE URINE
Amphetamine Screen, Ur: NEGATIVE
Benzodiazepines.: POSITIVE — AB
Marijuana Metabolite: POSITIVE — AB
Methadone: NEGATIVE
Phencyclidine (PCP): NEGATIVE

## 2013-02-27 DIAGNOSIS — N400 Enlarged prostate without lower urinary tract symptoms: Secondary | ICD-10-CM | POA: Insufficient documentation

## 2013-02-27 MED ORDER — LEVOTHYROXINE SODIUM 25 MCG PO TABS: 25.0000 ug | ORAL_TABLET | Freq: Every day | ORAL | Status: DC

## 2013-02-27 NOTE — Assessment & Plan Note (Signed)
His TSH is a little high so I have asked him to start levothyroxine

## 2013-02-27 NOTE — Assessment & Plan Note (Signed)
Exam done Vaccines were reviewed Labs ordered He was referred for a colonoscopy

## 2013-02-27 NOTE — Assessment & Plan Note (Signed)
He agrees to start crestor I will recheck his FLP today

## 2013-02-27 NOTE — Assessment & Plan Note (Signed)
He was referred for a f/up colonoscopy

## 2013-02-27 NOTE — Assessment & Plan Note (Signed)
Continue current meds I will check his UDS today

## 2013-02-27 NOTE — Assessment & Plan Note (Signed)
No s/s noted I will check his PSA today

## 2013-02-27 NOTE — Assessment & Plan Note (Signed)
He will continue the current meds for pain and spasms He signed a CSC today I will check his UDS

## 2013-02-28 ENCOUNTER — Encounter: Payer: Self-pay | Admitting: Internal Medicine

## 2013-03-01 LAB — BENZODIAZEPINES (GC/LC/MS), URINE
Clonazepam metabolite (GC/LC/MS), ur confirm: NEGATIVE ng/mL
Diazepam (GC/LC/MS), ur confirm: NEGATIVE ng/mL
Lorazepam (GC/LC/MS), ur confirm: NEGATIVE ng/mL
Nordiazepam (GC/LC/MS), ur confirm: 89 ng/mL
Oxazepam (GC/LC/MS), ur confirm: 449 ng/mL
Temazepam (GC/LC/MS), ur confirm: 163 ng/mL
Triazolam metabolite (GC/LC/MS), ur confirm: NEGATIVE ng/mL

## 2013-03-01 LAB — CANNABANOIDS (GC/LC/MS), URINE: THC-COOH (GC/LC/MS), ur confirm: >1000 ng/mL — ABNORMAL HIGH

## 2013-03-24 ENCOUNTER — Telehealth: Payer: Self-pay

## 2013-03-24 NOTE — Telephone Encounter (Signed)
Called pt whose expressed concern about cost of last office visit. Pt states that he came in to have his prescriptions refilled and was told that he is overdue for a CPX. Per pt he was only here for about 15 mins and charged 285$,plus 225$ for a CPX , an additional $25 for something else. He stated that he has yet to receive the bill for labs which are about 700$. Pt has " no insurance", a 10,000 deductible that has not been met. He has just finished paying off a bill from last year, which makes it hard to come in and see the MD. He states that he had to c/x 3 appts last year due to cost. He feels that nobody cares and healthcare is only after money.

## 2013-03-24 NOTE — Telephone Encounter (Signed)
Message copied by Sandi Mealy on Thu Mar 24, 2013  9:53 AM ------      Message from: Etta Grandchild      Created: Wed Mar 23, 2013  9:37 AM      Regarding: RE: Annual Exam       Please ask him what he is calling about.             Thanks,            Leitha Schuller            ----- Message -----         From: Sandi Mealy, CMA         Sent: 03/23/2013   8:16 AM           To: Etta Grandchild, MD      Subject: [name]: Annual Exam                                                      ----- Message -----         From: Crawford Givens McClinton         Sent: 03/22/2013   1:31 PM           To: Sandi Mealy, CMA      Subject: Annual Exam                                              [name] Haney,            [name] Haney would like for Dr. Yetta Barre to give him a call @ [phone]. He said has questions that pertain to his visit on date of service 02/25/13.  I'm not certain what answers he's seeking besides what's in the documentation.            Thanks  Ruby                     ------

## 2013-03-24 NOTE — Telephone Encounter (Signed)
It IS unfortunate that his insurance policy does not cover more than it does

## 2013-04-15 ENCOUNTER — Telehealth: Payer: Self-pay

## 2013-04-15 NOTE — Telephone Encounter (Signed)
Received fax from [facility] requesting RX for cialis 5 mg . Please advise, I do not see where we have filled this before.  Thanks

## 2013-04-20 ENCOUNTER — Telehealth: Payer: Self-pay

## 2013-04-20 DIAGNOSIS — N529 Male erectile dysfunction, unspecified: Secondary | ICD-10-CM

## 2013-04-20 MED ORDER — TADALAFIL 5 MG PO TABS: 5.0000 mg | ORAL_TABLET | Freq: Every day | ORAL | Status: DC | PRN

## 2013-04-20 NOTE — Telephone Encounter (Signed)
Pt request rx for cialis 5 mg sent to [facility] pharmacy. Thanks

## 2013-08-31 ENCOUNTER — Telehealth: Payer: Self-pay

## 2013-08-31 NOTE — Telephone Encounter (Signed)
No, he needs to be seen at least every 6 months

## 2013-08-31 NOTE — Telephone Encounter (Signed)
Please advise if ok to refill testosterone, pt last seen 02/25/13 and rx last filled 10/19/12 #10 ml/3rf Thanks

## 2013-12-21 ENCOUNTER — Telehealth: Payer: Self-pay

## 2013-12-21 NOTE — Telephone Encounter (Signed)
PA initiated via covermymeds, pending their approval.

## 2014-02-21 ENCOUNTER — Encounter: Payer: Self-pay | Admitting: Internal Medicine

## 2014-02-21 ENCOUNTER — Other Ambulatory Visit (INDEPENDENT_AMBULATORY_CARE_PROVIDER_SITE_OTHER): Payer: BC Managed Care – PPO

## 2014-02-21 ENCOUNTER — Ambulatory Visit (INDEPENDENT_AMBULATORY_CARE_PROVIDER_SITE_OTHER): Payer: BC Managed Care – PPO | Admitting: Internal Medicine

## 2014-02-21 VITALS — BP 122/80 | HR 59 | Temp 98.2°F | Resp 16 | Wt 191.0 lb

## 2014-02-21 DIAGNOSIS — E039 Hypothyroidism, unspecified: Secondary | ICD-10-CM

## 2014-02-21 DIAGNOSIS — G894 Chronic pain syndrome: Secondary | ICD-10-CM

## 2014-02-21 DIAGNOSIS — Z23 Encounter for immunization: Secondary | ICD-10-CM

## 2014-02-21 DIAGNOSIS — E7800 Pure hypercholesterolemia, unspecified: Secondary | ICD-10-CM

## 2014-02-21 DIAGNOSIS — F1210 Cannabis abuse, uncomplicated: Secondary | ICD-10-CM

## 2014-02-21 DIAGNOSIS — Z0000 Encounter for general adult medical examination without abnormal findings: Secondary | ICD-10-CM

## 2014-02-21 LAB — URINALYSIS, ROUTINE W REFLEX MICROSCOPIC
Bilirubin Urine: NEGATIVE
HGB URINE DIPSTICK: NEGATIVE
Ketones, ur: NEGATIVE
Nitrite: NEGATIVE
Specific Gravity, Urine: 1.01 (ref 1.000–1.030)
Total Protein, Urine: NEGATIVE
Urine Glucose: NEGATIVE
Urobilinogen, UA: 0.2 (ref 0.0–1.0)
pH: 6 (ref 5.0–8.0)

## 2014-02-21 LAB — LIPID PANEL
Cholesterol: 264 mg/dL — ABNORMAL HIGH (ref 0–200)
HDL: 36.5 mg/dL — AB (ref >39.00)
LDL Cholesterol: 188 mg/dL — ABNORMAL HIGH (ref 0–99)
Total CHOL/HDL Ratio: 7
Triglycerides: 199 mg/dL — ABNORMAL HIGH (ref 0.0–149.0)
VLDL: 39.8 mg/dL (ref 0.0–40.0)

## 2014-02-21 LAB — CBC WITH DIFFERENTIAL/PLATELET
BASOS ABS: 0.1 10*3/uL (ref 0.0–0.1)
Basophils Relative: 0.7 % (ref 0.0–3.0)
EOS ABS: 0.2 10*3/uL (ref 0.0–0.7)
Eosinophils Relative: 1.4 % (ref 0.0–5.0)
HEMATOCRIT: 49.5 % (ref 39.0–52.0)
Hemoglobin: 17 g/dL (ref 13.0–17.0)
LYMPHS ABS: 1.9 10*3/uL (ref 0.7–4.0)
Lymphocytes Relative: 17.7 % (ref 12.0–46.0)
MCHC: 34.3 g/dL (ref 30.0–36.0)
MCV: 93.6 fl (ref 78.0–100.0)
Monocytes Absolute: 0.8 10*3/uL (ref 0.1–1.0)
Monocytes Relative: 7.5 % (ref 3.0–12.0)
Neutro Abs: 7.9 10*3/uL — ABNORMAL HIGH (ref 1.4–7.7)
Neutrophils Relative %: 72.7 % (ref 43.0–77.0)
Platelets: 251 10*3/uL (ref 150.0–400.0)
RBC: 5.29 Mil/uL (ref 4.22–5.81)
RDW: 15.1 % — AB (ref 11.5–14.6)
WBC: 10.9 10*3/uL — ABNORMAL HIGH (ref 4.5–10.5)

## 2014-02-21 LAB — COMPREHENSIVE METABOLIC PANEL
ALT: 11 U/L (ref 0–53)
AST: 16 U/L (ref 0–37)
Albumin: 3.9 g/dL (ref 3.5–5.2)
Alkaline Phosphatase: 60 U/L (ref 39–117)
BILIRUBIN TOTAL: 0.7 mg/dL (ref 0.3–1.2)
BUN: 17 mg/dL (ref 6–23)
CO2: 26 mEq/L (ref 19–32)
Calcium: 9.3 mg/dL (ref 8.4–10.5)
Chloride: 104 mEq/L (ref 96–112)
Creatinine, Ser: 1.1 mg/dL (ref 0.4–1.5)
GFR: 68.8 mL/min (ref >60.00)
Glucose, Bld: 96 mg/dL (ref 70–99)
Potassium: 4.4 mEq/L (ref 3.5–5.1)
SODIUM: 137 meq/L (ref 135–145)
Total Protein: 7.3 g/dL (ref 6.0–8.3)

## 2014-02-21 LAB — TSH: TSH: 9.69 u[IU]/mL — ABNORMAL HIGH (ref 0.35–5.50)

## 2014-02-21 LAB — PSA: PSA: 1.1 ng/mL (ref 0.10–4.00)

## 2014-02-21 LAB — FECAL OCCULT BLOOD, GUAIAC: FECAL OCCULT BLD: NEGATIVE

## 2014-02-21 MED ORDER — ROSUVASTATIN CALCIUM 20 MG PO TABS: 20.0000 mg | ORAL_TABLET | Freq: Every day | ORAL | Status: DC

## 2014-02-21 MED ORDER — LEVOTHYROXINE SODIUM 25 MCG PO TABS: 25.0000 ug | ORAL_TABLET | Freq: Every day | ORAL | Status: DC

## 2014-02-21 NOTE — Assessment & Plan Note (Signed)
He is doing well on crestor I will recheck his FLP today

## 2014-02-21 NOTE — Assessment & Plan Note (Addendum)
He is not willing to quit using THC so I have informed him that I will not prescribe narcotics for him any more

## 2014-02-21 NOTE — Progress Notes (Signed)
Pre visit review using our clinic review tool, if applicable. No additional management support is needed unless otherwise documented below in the visit note. 

## 2014-02-21 NOTE — Progress Notes (Signed)
Subjective:    Patient ID: [name] Haney, male    DOB: Nov 18, 1950, 63 y.o.   MRN: [id number]  Thyroid Problem Presents for follow-up visit. Patient reports no anxiety, cold intolerance, constipation, depressed mood, diaphoresis, diarrhea, dry skin, fatigue, hair loss, heat intolerance, hoarse voice, leg swelling, nail problem, palpitations, tremors, visual change, weight gain or weight loss. Past treatments include nothing (he is not willing to take the thyroid medication). The treatment provided no relief.      Review of Systems  Constitutional: Negative.  Negative for fever, chills, weight loss, weight gain, diaphoresis, appetite change and fatigue.  HENT: Negative.  Negative for hoarse voice.   Eyes: Negative.   Respiratory: Negative.  Negative for cough, choking, chest tightness, shortness of breath, wheezing and stridor.   Cardiovascular: Negative.  Negative for chest pain, palpitations and leg swelling.  Gastrointestinal: Negative.  Negative for nausea, vomiting, abdominal pain, diarrhea, constipation and blood in stool.  Endocrine: Negative.  Negative for cold intolerance and heat intolerance.  Genitourinary: Negative.   Musculoskeletal: Negative.  Negative for arthralgias, back pain, gait problem, joint swelling, myalgias, neck pain and neck stiffness.  Skin: Negative.   Allergic/Immunologic: Negative.   Neurological: Negative.  Negative for dizziness, tremors, weakness and light-headedness.  Hematological: Negative.  Negative for adenopathy. Does not bruise/bleed easily.  Psychiatric/Behavioral: Negative.  Negative for suicidal ideas, behavioral problems, sleep disturbance, dysphoric mood and decreased concentration. The patient is not nervous/anxious.        Objective:   Physical Exam  Vitals reviewed. Constitutional: He is oriented to person, place, and time. He appears well-developed and well-nourished. No distress.  HENT:  Head: Normocephalic and atraumatic.    Mouth/Throat: Oropharynx is clear and moist. No oropharyngeal exudate.  Eyes: Conjunctivae are normal. Right eye exhibits no discharge. Left eye exhibits no discharge. No scleral icterus.  Neck: Normal range of motion. Neck supple. No JVD present. No tracheal deviation present. No thyromegaly present.  Cardiovascular: Normal rate, regular rhythm, normal heart sounds and intact distal pulses.  Exam reveals no gallop and no friction rub.   No murmur heard. Pulmonary/Chest: Effort normal and breath sounds normal. No stridor. No respiratory distress. He has no wheezes. He has no rales. He exhibits no tenderness.  Abdominal: Soft. Bowel sounds are normal. He exhibits no distension and no mass. There is no tenderness. There is no rebound and no guarding. Hernia confirmed negative in the right inguinal area and confirmed negative in the left inguinal area.  Genitourinary: Rectum normal, prostate normal, testes normal and penis normal. Rectal exam shows no external hemorrhoid, no internal hemorrhoid, no fissure, no mass, no tenderness and anal tone normal. Guaiac negative stool. Prostate is not enlarged and not tender. Right testis shows no mass, no swelling and no tenderness. Right testis is descended. Left testis shows no mass, no swelling and no tenderness. Left testis is descended. Circumcised. No penile erythema or penile tenderness. No discharge found.  He has bilateral testicular atrophy  Musculoskeletal: Normal range of motion. He exhibits no edema and no tenderness.  Lymphadenopathy:    He has no cervical adenopathy.       Right: No inguinal adenopathy present.       Left: No inguinal adenopathy present.  Neurological: He is oriented to person, place, and time.  Skin: Skin is warm and dry. No rash noted. He is not diaphoretic. No erythema. No pallor.  Psychiatric: He has a normal mood and affect. His behavior is normal. Judgment and thought  content normal.     Lab Results  Component Value  Date   WBC 12.9* 02/25/2013   HGB 15.9 02/25/2013   HCT 46.3 02/25/2013   PLT 269.0 02/25/2013   GLUCOSE 86 02/25/2013   CHOL 213* 02/25/2013   TRIG 200.0* 02/25/2013   HDL 34.40* 02/25/2013   LDLDIRECT 158.5 02/25/2013   ALT 17 02/25/2013   AST 25 02/25/2013   NA 135 02/25/2013   K 4.1 02/25/2013   CL 100 02/25/2013   CREATININE 1.2 02/25/2013   BUN 9 02/25/2013   CO2 28 02/25/2013   TSH 7.99* 02/25/2013   PSA 1.35 02/25/2013   INR 2.2* 01/15/2008   HGBA1C 5.6 04/18/2011       Assessment & Plan:

## 2014-02-21 NOTE — Assessment & Plan Note (Addendum)
He has not taken the synthroid for one year, he tells me that he is not willing to take it I will recheck his TSH level today and if it elevated I will re-prescribe synthroid  Late note - his TSH is elevated so I have asked him to start taking synthroid

## 2014-02-21 NOTE — Patient Instructions (Signed)
Health Maintenance, Males A healthy lifestyle and preventative care can promote health and wellness.  Maintain regular health, dental, and eye exams.  Eat a healthy diet. Foods like vegetables, fruits, whole grains, low-fat dairy products, and lean protein foods contain the nutrients you need and are low in calories. Decrease your intake of foods high in solid fats, added sugars, and salt. Get information about a proper diet from your health care provider, if necessary.  Regular physical exercise is one of the most important things you can do for your health. Most adults should get at least 150 minutes of moderate-intensity exercise (any activity that increases your heart rate and causes you to sweat) each week. In addition, most adults need muscle-strengthening exercises on 2 or more days a week.   Maintain a healthy weight. The body mass index (BMI) is a screening tool to identify possible weight problems. It provides an estimate of body fat based on height and weight. Your health care provider can find your BMI and can help you achieve or maintain a healthy weight. For males 20 years and older:  A BMI below 18.5 is considered underweight.  A BMI of 18.5 to 24.9 is normal.  A BMI of 25 to 29.9 is considered overweight.  A BMI of 30 and above is considered obese.  Maintain normal blood lipids and cholesterol by exercising and minimizing your intake of saturated fat. Eat a balanced diet with plenty of fruits and vegetables. Blood tests for lipids and cholesterol should begin at age 20 and be repeated every 5 years. If your lipid or cholesterol levels are high, you are over 50, or you are at high risk for heart disease, you may need your cholesterol levels checked more frequently.Ongoing high lipid and cholesterol levels should be treated with medicines, if diet and exercise are not working.  If you smoke, find out from your health care provider how to quit. If you do not use tobacco, do not  start.  Lung cancer screening is recommended for adults aged 55 80 years who are at high risk for developing lung cancer because of a history of smoking. A yearly low-dose CT scan of the lungs is recommended for people who have at least a 30-pack-year history of smoking and are a current smoker or have quit within the past 15 years. A pack year of smoking is smoking an average of 1 pack of cigarettes a day for 1 year (for example, a 30-pack-year history of smoking could mean smoking 1 pack a day for 30 years or 2 packs a day for 15 years). Yearly screening should continue until the smoker has stopped smoking for at least 15 years. Yearly screening should be stopped for people who develop a health problem that would prevent them from having lung cancer treatment.  If you choose to drink alcohol, do not have more than 2 drinks per day. One drink is considered to be 12 oz (360 mL) of beer, 5 oz (150 mL) of wine, or 1.5 oz (45 mL) of liquor.  Avoid use of street drugs. Do not share needles with anyone. Ask for help if you need support or instructions about stopping the use of drugs.  High blood pressure causes heart disease and increases the risk of stroke. Blood pressure should be checked at least every 1 2 years. Ongoing high blood pressure should be treated with medicines if weight loss and exercise are not effective.  If you are 45 64 years old, ask your health   care provider if you should take aspirin to prevent heart disease.  Diabetes screening involves taking a blood sample to check your fasting blood sugar level. This should be done once every 3 years after age 45, if you are at a normal weight and without risk factors for diabetes. Testing should be considered at a younger age or be carried out more frequently if you are overweight and have at least 1 risk factor for diabetes.  Colorectal cancer can be detected and often prevented. Most routine colorectal cancer screening begins at the age of 50  and continues through age 75. However, your health care provider may recommend screening at an earlier age if you have risk factors for colon cancer. On a yearly basis, your health care provider may provide home test kits to check for hidden blood in the stool. A small camera at the end of a tube may be used to directly examine the colon (sigmoidoscopy or colonoscopy) to detect the earliest forms of colorectal cancer. Talk to your health care provider about this at age 50, when routine screening begins. A direct exam of the colon should be repeated every 5 10 years through age 75, unless early forms of pre-cancerous polyps or small growths are found.  People who are at an increased risk for hepatitis B should be screened for this virus. You are considered at high risk for hepatitis B if:  You were born in a country where hepatitis B occurs often. Talk with your health care provider about which countries are considered high-risk.  Your parents were born in a high-risk country and you have not received a shot to protect against hepatitis B (hepatitis B vaccine).  You have HIV or AIDS.  You use needles to inject street drugs.  You live with, or have sex with, someone who has hepatitis B.  You are a man who has sex with other men (MSM).  You get hemodialysis treatment.  You take certain medicines for conditions like cancer, organ transplantation, and autoimmune conditions.  Hepatitis C blood testing is recommended for all people born from 1945 through 1965 and any individual with known risk factors for hepatitis C.  Healthy men should no longer receive prostate-specific antigen (PSA) blood tests as part of routine cancer screening. Talk to your health care provider about prostate cancer screening.  Testicular cancer screening is not recommended for adolescents or adult males who have no symptoms. Screening includes self-exam, a health care provider exam, and other screening tests. Consult with  your health care provider about any symptoms you have or any concerns you have about testicular cancer.  Practice safe sex. Use condoms and avoid high-risk sexual practices to reduce the spread of sexually transmitted infections (STIs).  Use sunscreen. Apply sunscreen liberally and repeatedly throughout the day. You should seek shade when your shadow is shorter than you. Protect yourself by wearing long sleeves, pants, a wide-brimmed hat, and sunglasses year round, whenever you are outdoors.  Tell your health care provider of new moles or changes in moles, especially if there is a change in shape or color. Also tell your provider if a mole is larger than the size of a pencil eraser.  A one-time screening for abdominal aortic aneurysm (AAA) and surgical repair of large AAAs by ultrasound is recommended for men aged 65 75 years who are current or former smokers.  Stay current with your vaccines (immunizations). Document Released: 05/01/2008 Document Revised: 08/24/2013 Document Reviewed: 03/31/2011 Exit[employer] Patient Information 2014 [employer].   Hypothyroidism The thyroid is a large gland located in the lower front of your neck. The thyroid gland helps control metabolism. Metabolism is how your body handles food. It controls metabolism with the hormone thyroxine. When this gland is underactive (hypothyroid), it produces too little hormone.  CAUSES These include:   Absence or destruction of thyroid tissue.  Goiter due to iodine deficiency.  Goiter due to medications.  Congenital defects (since birth).  Problems with the pituitary. This causes a lack of TSH (thyroid stimulating hormone). This hormone tells the thyroid to turn out more hormone. SYMPTOMS  Lethargy (feeling as though you have no energy)  Cold intolerance  Weight gain (in spite of normal food intake)  Dry skin  Coarse hair  Menstrual irregularity (if severe, may lead to infertility)  Slowing of thought  processes Cardiac problems are also caused by insufficient amounts of thyroid hormone. Hypothyroidism in the newborn is cretinism, and is an extreme form. It is important that this form be treated adequately and immediately or it will lead rapidly to retarded physical and mental development. DIAGNOSIS  To prove hypothyroidism, your caregiver may do blood tests and ultrasound tests. Sometimes the signs are hidden. It may be necessary for your caregiver to watch this illness with blood tests either before or after diagnosis and treatment. TREATMENT  Low levels of thyroid hormone are increased by using synthetic thyroid hormone. This is a safe, effective treatment. It usually takes about four weeks to gain the full effects of the medication. After you have the full effect of the medication, it will generally take another four weeks for problems to leave. Your caregiver may start you on low doses. If you have had heart problems the dose may be gradually increased. It is generally not an emergency to get rapidly to normal. HOME CARE INSTRUCTIONS   Take your medications as your caregiver suggests. Let your caregiver know of any medications you are taking or start taking. Your caregiver will help you with dosage schedules.  As your condition improves, your dosage needs may increase. It will be necessary to have continuing blood tests as suggested by your caregiver.  Report all suspected medication side effects to your caregiver. SEEK MEDICAL CARE IF: Seek medical care if you develop:  Sweating.  Tremulousness (tremors).  Anxiety.  Rapid weight loss.  Heat intolerance.  Emotional swings.  Diarrhea.  Weakness. SEEK IMMEDIATE MEDICAL CARE IF:  You develop chest pain, an irregular heart beat (palpitations), or a rapid heart beat. MAKE SURE YOU:   Understand these instructions.  Will watch your condition.  Will get help right away if you are not doing well or get worse. Document Released:  11/03/2005 Document Revised: 01/26/2012 Document Reviewed: 06/23/2008 Exit[employer] Patient Information 2014 [employer].  

## 2014-02-21 NOTE — Assessment & Plan Note (Signed)
He refused a pneumovax today Exam done Labs ordered Pt ed material was given

## 2014-02-21 NOTE — Assessment & Plan Note (Signed)
He did not complain of any pain today

## 2014-02-22 ENCOUNTER — Encounter: Payer: Self-pay | Admitting: Internal Medicine

## 2014-02-22 LAB — HEPATITIS C ANTIBODY: HCV Ab: NEGATIVE

## 2014-02-23 ENCOUNTER — Telehealth: Payer: Self-pay | Admitting: Internal Medicine

## 2014-02-23 NOTE — Telephone Encounter (Signed)
Dismissal Letter sent by Certified Mail 02/58/5277  Received the Return Receipt showing someone picked up the Dismissal 02/28/2014

## 2014-12-23 ENCOUNTER — Other Ambulatory Visit: Payer: Self-pay | Admitting: Internal Medicine

## 2016-04-30 ENCOUNTER — Other Ambulatory Visit (HOSPITAL_COMMUNITY): Payer: Self-pay | Admitting: Orthopedic Surgery

## 2016-04-30 DIAGNOSIS — M79605 Pain in left leg: Secondary | ICD-10-CM

## 2016-04-30 DIAGNOSIS — M7989 Other specified soft tissue disorders: Principal | ICD-10-CM

## 2016-05-01 ENCOUNTER — Ambulatory Visit (HOSPITAL_COMMUNITY)
Admission: RE | Admit: 2016-05-01 | Discharge: 2016-05-01 | Disposition: A | Discharge status: Home / Self Care | Payer: Medicare Other | Source: Ambulatory Visit | Attending: Orthopedic Surgery | Admitting: Orthopedic Surgery

## 2016-05-01 DIAGNOSIS — M79602 Pain in left arm: Secondary | ICD-10-CM

## 2016-05-01 DIAGNOSIS — F329 Major depressive disorder, single episode, unspecified: Secondary | ICD-10-CM | POA: Insufficient documentation

## 2016-05-01 DIAGNOSIS — M79605 Pain in left leg: Secondary | ICD-10-CM | POA: Diagnosis present

## 2016-05-01 DIAGNOSIS — M7989 Other specified soft tissue disorders: Secondary | ICD-10-CM | POA: Insufficient documentation

## 2016-05-01 NOTE — Progress Notes (Signed)
Preliminary results by tech - Left Lower Ext. Venous Duplex Completed. Negative for deep and superficial vein thrombosis.  Chance Karam, BS, RDMS, RVT  

## 2016-08-19 ENCOUNTER — Ambulatory Visit (INDEPENDENT_AMBULATORY_CARE_PROVIDER_SITE_OTHER): Payer: Medicare Other | Admitting: Family Medicine

## 2016-08-19 ENCOUNTER — Encounter: Payer: Self-pay | Admitting: Family Medicine

## 2016-08-19 VITALS — BP 137/83 | HR 57 | Ht 67.0 in | Wt 193.9 lb

## 2016-08-19 DIAGNOSIS — R5383 Other fatigue: Secondary | ICD-10-CM | POA: Diagnosis not present

## 2016-08-19 DIAGNOSIS — Z1389 Encounter for screening for other disorder: Secondary | ICD-10-CM

## 2016-08-19 DIAGNOSIS — E669 Obesity, unspecified: Secondary | ICD-10-CM | POA: Diagnosis not present

## 2016-08-19 DIAGNOSIS — E782 Mixed hyperlipidemia: Secondary | ICD-10-CM

## 2016-08-19 DIAGNOSIS — R7989 Other specified abnormal findings of blood chemistry: Secondary | ICD-10-CM

## 2016-08-19 DIAGNOSIS — E781 Pure hyperglyceridemia: Secondary | ICD-10-CM

## 2016-08-19 DIAGNOSIS — Z8659 Personal history of other mental and behavioral disorders: Secondary | ICD-10-CM

## 2016-08-19 DIAGNOSIS — G894 Chronic pain syndrome: Secondary | ICD-10-CM | POA: Diagnosis not present

## 2016-08-19 DIAGNOSIS — F1910 Other psychoactive substance abuse, uncomplicated: Secondary | ICD-10-CM | POA: Insufficient documentation

## 2016-08-19 DIAGNOSIS — R946 Abnormal results of thyroid function studies: Secondary | ICD-10-CM

## 2016-08-19 NOTE — Patient Instructions (Signed)
Guidelines for a Low Cholesterol, Low Saturated Fat Diet   Fats - Limit total intake of fats and oils. - Avoid butter, stick margarine, shortening, lard, palm and coconut oils. - Limit mayonnaise, salad dressings, gravies and sauces, unless they are homemade with low-fat ingredients. - Limit chocolate. - Choose low-fat and nonfat products, such as low-fat mayonnaise, low-fat or non-hydrogenated peanut butter, low-fat or fat-free salad dressings and nonfat gravy. - Use vegetable oil, such as canola or olive oil. - Look for margarine that does not contain trans fatty acids. - Use nuts in moderate amounts. - Read ingredient labels carefully to determine both amount and type of fat present in foods. Limit saturated and trans fats! - Avoid high-fat processed and convenience foods.  Meats and Meat Alternatives - Choose fish, chicken, [place] and lean meats. - Use dried beans, peas, lentils and tofu. - Limit egg yolks to three to four per week. - If you eat red meat, limit to no more than three servings per week and choose loin or round cuts. - Avoid fatty meats, such as bacon, sausage, franks, luncheon meats and ribs. - Avoid all organ meats, including liver.  Dairy - Choose nonfat or low-fat milk, yogurt and cottage cheese. - Most cheeses are high in fat. Choose cheeses made from non-fat milk, such as mozzarella and ricotta cheese. - Choose light or fat-free cream cheese and sour cream. - Avoid cream and sauces made with cream.  Fruits and Vegetables - Eat a wide variety of fruits and vegetables. - Use lemon juice, vinegar or "mist" olive oil on vegetables. - Avoid adding sauces, fat or oil to vegetables.  Breads, Cereals and Grains - Choose whole-grain breads, cereals, pastas and rice. - Avoid high-fat snack foods, such as granola, cookies, pies, pastries, doughnuts and croissants.  Cooking Tips - Avoid deep fried foods. - Trim visible fat off meats and remove skin from poultry  before cooking. - Bake, broil, boil, poach or roast poultry, fish and lean meats. - Drain and discard fat that drains out of meat as you cook it. - Add little or no fat to foods. - Use vegetable oil sprays to grease pans for cooking or baking. - Steam vegetables. - Use herbs or no-oil marinades to flavor foods.      For those diagnosed with high triglycerides, it's important to take action to lower your levels and improve your heart health.  Triglyceride is just a fancy word for fat - the fat in our bodies is stored in the form of triglycerides. Triglycerides are found in foods and manufactured in our bodies.  Normal triglyceride levels are defined as less than 150 mg/dL; 150 to 199 is considered borderline high; 200 to 499 is high; and 500 or higher is officially called very high. To me, anything over 150 is a red flag indicating my patient needs to take immediate steps to get the situation under control.   What is the significance of high triglycerides? High triglyceride levels make blood thicker and stickier, which means that it is more likely to form clots. Studies have shown that triglyceride levels are associated with increased risks of cardiovascular disease and stroke - in both men and women - alone or in combination with other risk factors (high triglycerides combined with high LDL cholesterol can be a particularly deadly combination). For example, in one ground-breaking study, high triglycerides alone increased the risk of cardiovascular disease by 14 percent in men, and by 77 percent in women. But when the  test subjects also had low HDL cholesterol (that's the good cholesterol) and other risk factors, high triglycerides increased the risk of disease by 32 percent in men and 71 percent in women.   Fortunately, triglycerides can sometimes be controlled with several diet and lifestyle changes.    What Factors Can Increase Triglycerides? As with cholesterol, eating too much of the wrong  kinds of fats will raise your blood triglycerides.  Therefore, it's important to restrict the amounts of saturated fats and trans fats you allow into your diet.  Triglyceride levels can also shoot up after eating foods that are high in carbohydrates or after drinking alcohol.  That's why triglyceride blood tests require an overnight fast.  If you have elevated triglycerides, it's especially important to avoid sugary and refined carbohydrates, including sugar, honey, and other sweeteners, soda and other sugary drinks, candy, baked goods, and anything made with white (refined or enriched) flour, including white bread, rolls, cereals, buns, pastries, regular pasta, and white rice.  You'll also want to limit dried fruit and fruit juice since they're dense in simple sugar.  All of these low-quality carbs cause a sudden rise in insulin, which may lead to a spike in triglycerides.  Triglycerides can also become elevated as a reaction to having diabetes, hypothyroidism, or kidney disease. As with most other heart-related factors, being overweight and inactive also contribute to abnormal triglycerides. And unfortunately, some people have a genetic predisposition that causes them to manufacture way too much triglycerides on their own, no matter how carefully they eat.   How Can You Lower Your Triglyceride Levels? If you are diagnosed with high triglycerides, it's important to take action. There are several things you can do to help lower your triglyceride levels and improve your heart health:  Lose weight if you are overweight.  There is a clear correlation between obesity and high triglycerides - the heavier people are, the higher their triglyceride levels are likely to be. The good news is that losing weight can significantly lower triglycerides. In a large study of individuals with type 2 diabetes, those assigned to the "lifestyle intervention group" - which involved counseling, a low-calorie meal plan, and  customized exercise program - lost 8.6% of their body weight and lowered their triglyceride levels by more than 16%. If you're overweight, find a weight loss plan that works for you and commit to shedding the pounds and getting healthier.  Reduce the amount of saturated fat and trans fat in your diet.  Start by avoiding or dramatically limiting butter, cream cheese, lard, sour cream, doughnuts, cakes, cookies, candy bars, regular ice cream, fried foods, pizza, cheese sauce, cream-based sauces and salad dressings, high-fat meats (including fatty hamburgers, bologna, pepperoni, sausage, bacon, salami, pastrami, spareribs, and hot dogs), high-fat cuts of beef and pork, and whole-milk dairy products.   Other ways to cut back: Choose lean meats only (including skinless chicken and [place], lean beef, lean pork), fish, and reduced-fat or fat-free dairy products.   Experiment with adding whole soy foods to your diet. Although soy itself may not reduce risk of heart disease, it replaces hazardous animal fats with healthier proteins. Choose high-quality soy foods, such as tofu, tempeh, soy milk, and edamame (whole soybeans).  Always remove skin from poultry.  Prepare foods by baking, roasting, broiling, boiling, poaching, steaming, grilling, or stir-frying in vegetable oil.  Most stick margarines contain trans fats, and trans fats are also found in some packaged baked goods, potato chips, snack foods, fried foods, and fast  food that use or create hydrogenated oils.    (All food labels must now list the amount of trans fats, right after the amount of saturated fats - good news for consumers. As a result, many food companies have now reformulated their products to be trans fat free.many, but not all! So it's still just as important to read labels and make sure the packaged foods you buy don't contain trans fats.)     If you use margarine, purchase soft-tub margarine spreads that contain 0 grams trans fats and  don't list any partially hydrogenated oils in the ingredients list. By substituting olive oil or vegetable oil for trans fats in just 2 percent of your daily calories, you can reduce your risk of heart disease by 53 percent.   There is no safe amount of trans fats, so try to keep them as far from your plate as possible.  Avoid foods that are concentrated in sugar (even dried fruit and fruit juice). Sugary foods can elevate triglyceride levels in the blood, so keep them to a bare minimum.  Swap out refined carbohydrates for whole grains.  Refined carbohydrates - like white rice, regular pasta, and anything made with white or "enriched" flour (including white bread, rolls, cereals, buns, and crackers) - raise blood sugar and insulin levels more than fiber-rich whole grains. Higher insulin levels, in turn, can lead to a higher rise in triglycerides after a meal. So, make the switch to whole wheat bread, whole grain pasta, brown or wild rice, and whole grain versions of cereals, crackers, and other bread products. However, it's important to know that individuals with high triglycerides should moderate even their intake of high-quality starches (since all starches raise blood sugar) - I recommend 1 to 2 servings per meal.  Cut way back on alcohol.  If you have high triglycerides, alcohol should be considered a rare treat - if you indulge at all, since even small amounts of alcohol can dramatically increase triglyceride levels.  Incorporate omega-3 fats.  Heart-healthy fish oils are especially rich in omega-3 fatty acids. In multiple studies over the past two decades, people who ate diets high in omega-3s had 30 to 40 percent reductions in heart disease. Although we don't yet know why fish oil works so well, there are several possibilities. Omega-3s seem to reduce inflammation, reduce high blood pressure, decrease triglycerides, raise HDL cholesterol, and make blood thinner and less sticky so it is less  likely to clot. It's as close to a food prescription for heart health as it gets. If you have high triglycerides, I recommend eating at least three servings of one of the omega-3-rich fish every week (fatty fish is the most concentrated food form of omega three fats). If you cannot manage to eat that much fish, speak with your physician about taking fish oil capsules, which offer similar benefits.The best foods for omega-3 fatty acids include wild salmon (fresh, canned), herring, mackerel (not king), sardines, anchovies, rainbow trout, and Pacific oysters. Non-fish sources of omega-3 fats include omega-3-fortified eggs, ground flaxseed, chia seeds, walnuts, butternuts (white walnuts), seaweed, walnut oil, canola oil, and soybeans.  Quit smoking.  Smoking causes inflammation, not just in your lungs, but throughout your body. Inflammation can contribute to atherosclerosis, blood clots, and risk of heart attack. Smoking makes all heart health indicators worse. If you have high cholesterol, high triglycerides, or high blood pressure, smoking magnifies the danger.  Become more physically active.  Even moderate exercise can help improve cholesterol, triglycerides, and  blood pressure. Aerobic exercise seems to be able to stop the sharp rise of triglycerides after eating, perhaps because of a decrease in the amount of triglyceride released by the liver, or because active muscle clears triglycerides out of the blood stream more quickly than inactive muscle. If you haven't exercised regularly (or at all) for years, I recommend starting slowly, by walking at an easy pace for 15 minutes a day. Then, as you feel more comfortable, increase the amount. Your ultimate goal should be at least 30 minutes of moderate physical activity, at least five days a week.

## 2016-08-19 NOTE — Assessment & Plan Note (Signed)
Shoulder, neck and back. Chronic.  [name] was giving pt oxy and muscle relaxors but stopped b/c pt is using marijuana.   [name]- ortho with [name]. Seen him for shoulder injections and knee problems etc.

## 2016-08-19 NOTE — Progress Notes (Signed)
New patient office visit note:  Impression and Recommendations:    1. Obesity, Class I, BMI 30-34.9   2. h/o Mixed hyperlipidemia   3. h/o Hypertriglyceridemia   4. Chronic pain syndrome   5. Substance abuse   6. Other fatigue   7. History of depression   8. Screening for multiple conditions   9. h/o Elevated TSH      Chronic pain syndrome - Shoulder, neck and back. Chronic.  [name] was giving pt oxy and muscle relaxors but stopped b/c pt is using marijuana.  - Pt has seen [name]- ortho with [name]. Seen him for shoulder injections and knee problems etc.   - I explained to patient that I do not treat chronic pain narcotics and chronic benzos, but I'm happy to try alternative medicines to see if he could get relief. - I explained the patient I could not condone the use of marijuana since it is illegal in our state, however if patient feels it works very well for him, he should sincerely consider moving to a state where it's use for recreation / medicinal purposes is legal.   obtain labs today- pt is essentially fasting. Patient has not had any blood work in the past 2 and half years. History of elevated TSH and abnormal lipid panel in past   History of elevated triglycerides and cholesterol levels. Gave patient educational handouts on dietary and lifestyle changes as first-line treatment for these conditions.    Orders Placed This Encounter  Procedures  . COMPLETE METABOLIC PANEL WITH GFR  . CBC with Differential/Platelet  . Lipid panel  . TSH  . VITAMIN D 25 Hydroxy (Vit-D Deficiency, Fractures)  . Vitamin B12  . Hemoglobin A1c  . Direct LDL    New Prescriptions   No medications on file    Modified Medications   No medications on file    Discontinued Medications   LEVOTHYROXINE (LEVOTHROID) 25 MCG TABLET    Take 1 tablet (25 mcg total) by mouth daily before breakfast.   ROSUVASTATIN (CRESTOR) 20 MG TABLET    Take 1 tablet (20 mg total)  by mouth daily.   SYRINGE/NEEDLE, DISP, (SYRINGE 3CC/25GX1") 25G X 1" 3 ML MISC    Use every 14 days with testosterone   TADALAFIL (CIALIS) 5 MG TABLET    Take 1 tablet (5 mg total) by mouth daily as needed for erectile dysfunction.    Return for f/up 2-3 wks to discuss labs and f/up chronic issues.  The patient was counseled, risk factors were discussed, anticipatory guidance given.  Gross side effects, risk and benefits, and alternatives of medications discussed with patient.  Patient is aware that all medications have potential side effects and we are unable to predict every side effect or drug-drug interaction that may occur.  Expresses verbal understanding and consents to current therapy plan and treatment regimen.  Please see AVS handed out to patient at the end of our visit for further patient instructions/ counseling done pertaining to today's office visit.    Note: This document was prepared using Dragon voice recognition software and may include unintentional dictation errors.  ----------------------------------------------------------------------------------------------------------------------    Subjective:    Chief Complaint  Patient presents with  . Establish Care    HPI: [name] Haney is a pleasant 65 y.o. male who presents to [place] at [facility] today to review their medical history with me and establish care.  I asked the patient to review their chronic problem list with me to ensure everything was updated and accurate.     Looking for a new doc- old PCP was Parks Ranger, MD - changing because recently b/c "they just didn't get along. "  The doctor prescribed him chronic narcotics and upon drug screen, illegal substances apparently were found or something of that manner. Patient was told by physician he would not treat him any longer  Patient admits that he smokes cannibus daily for chronic pain mgt.   Moved from [place] to [facility] 2006.  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   Married- 3rd marriage in 1999.  None of own kids. 3 step-kids.    Biggest concern today is patient is wanting to get his OxyContin 10mg  1-2 TID refilled as well as his Valium 10 mg, 3 times a day.    Off these medicines for a year or more now.  Has been using marijuana daily for pain relief-working well.   He denies to me that he has been getting it from anyone else since he left Dr. Griffin Basil office in may/June 2014  Wt Readings from Last 3 Encounters:  08/19/16 193 lb 14.4 oz (88 kg)  02/21/14 191 lb (86.6 kg)  02/25/13 203 lb (92.1 kg)   BP Readings from Last 3 Encounters:  08/19/16 137/83  02/21/14 122/80  02/25/13 120/84   Pulse Readings from Last 3 Encounters:  08/19/16 (!) 57  02/21/14 (!) 59  02/25/13 83   BMI Readings from Last 3 Encounters:  08/19/16 30.37 kg/m  02/21/14 29.91 kg/m  02/25/13 31.79 kg/m    Patient Active Problem List   Diagnosis Date Noted  . h/o Mixed hyperlipidemia 08/19/2016    Priority: High  . h/o Hypertriglyceridemia 08/19/2016    Priority: High  . Obesity, Class I, BMI 30-34.9 08/19/2016    Priority: High  . Marijuana abuse 02/21/2014    Priority: High  . Chronic pain syndrome 02/25/2013    Priority: High  . Generalized OA- multiple joints 03/12/2009    Priority: High  . History of depression 03/12/2009    Priority: Medium  . h/o Elevated TSH 08/24/2016  . Substance abuse 08/19/2016  . Other fatigue 08/19/2016  . Need for prophylactic vaccination against Streptococcus pneumoniae (pneumococcus) 02/21/2014  . BPH (benign prostatic hyperplasia) 02/27/2013  . COLONIC POLYPS, HX OF 02/25/2013  . Routine general medical examination at a health care facility 01/15/2012  . Screening, ischemic heart disease 01/15/2012  . Hypogonadism male 04/25/2011  . Unspecified hypothyroidism 04/25/2011  . Low back pain 04/18/2011  . ED (erectile dysfunction) 04/18/2011  . Personal history of asthma  03/12/2009  . PEPTIC ULCER DISEASE 03/12/2009     Past Medical History:  Diagnosis Date  . Arthritis   . Asthma   . Depression   . Substance abuse   . Thyroid disease      Past Surgical History:  Procedure Laterality Date  . LUMBAR DISC SURGERY     x 2  . MEDIAL PARTIAL KNEE REPLACEMENT Right 2009  . NECK SURGERY  2006     Family History  Problem Relation Age of Onset  . Diabetes Mother 29  . Arthritis Mother   . Heart disease Mother 97  . Stroke Father 70    x 12     History  Drug Use  . Types: Marijuana    History  Alcohol Use No    History  Smoking Status  .  Former Smoker  . Packs/day: 0.50  . Years: 15.00  . Types: Cigarettes  . Quit date: 11/18/1987  Smokeless Tobacco  . Never Used    Patient's Medications  New Prescriptions   No medications on file  Previous Medications   TESTOSTERONE CYPIONATE (DEPOTESTOTERONE CYPIONATE) 200 MG/ML INJECTION    Inject 1 mL (200 mg total) into the muscle every 14 (fourteen) days.  Modified Medications   No medications on file  Discontinued Medications   LEVOTHYROXINE (LEVOTHROID) 25 MCG TABLET    Take 1 tablet (25 mcg total) by mouth daily before breakfast.   ROSUVASTATIN (CRESTOR) 20 MG TABLET    Take 1 tablet (20 mg total) by mouth daily.   SYRINGE/NEEDLE, DISP, (SYRINGE 3CC/25GX1") 25G X 1" 3 ML MISC    Use every 14 days with testosterone   TADALAFIL (CIALIS) 5 MG TABLET    Take 1 tablet (5 mg total) by mouth daily as needed for erectile dysfunction.    Allergies: Avelox [moxifloxacin hcl in nacl]  Review of Systems  Constitutional: Negative.  Negative for chills, diaphoresis, fever, malaise/fatigue and weight loss.  HENT: Negative.  Negative for congestion, sore throat and tinnitus.   Eyes: Negative.  Negative for blurred vision, double vision and photophobia.  Respiratory: Negative.  Negative for cough and wheezing.   Cardiovascular: Negative.  Negative for chest pain and palpitations.    Gastrointestinal: Negative.  Negative for blood in stool, diarrhea, nausea and vomiting.  Genitourinary: Negative.  Negative for dysuria, frequency and urgency.  Musculoskeletal: Positive for joint pain and myalgias.  Skin: Negative.  Negative for itching and rash.  Neurological: Negative.  Negative for dizziness, focal weakness, weakness and headaches.  Endo/Heme/Allergies: Negative.  Negative for environmental allergies and polydipsia. Does not bruise/bleed easily.  Psychiatric/Behavioral: Negative for depression and memory loss. The patient has insomnia. The patient is not nervous/anxious.      Objective:   Blood pressure 137/83, pulse (!) 57, height 5\' 7"  (1.702 m), weight 193 lb 14.4 oz (88 kg). Body mass index is 30.37 kg/m. General: Well Developed, well nourished, and in no acute distress.  Neuro: Alert and oriented x3, extra-ocular muscles intact, sensation grossly intact.  HEENT: Normocephalic, atraumatic, pupils equal round reactive to light, neck supple Skin: no gross suspicious lesions or rashes  Cardiac: Regular rate and rhythm, no murmurs rubs or gallops.  Respiratory: Essentially clear to auscultation bilaterally, coarse breath sounds bilateral bases otherwise no wheeze/rales or rhonchi. Not using accessory muscles, speaking in full sentences.  Musculoskeletal: Ambulates w/o diff, FROM * 4 ext.  Vasc: less 2 sec cap RF, warm and pink  Psych:  No HI/SI, judgement and insight good, Euthymic mood. Full Affect.

## 2016-08-20 LAB — CBC WITH DIFFERENTIAL/PLATELET
BASOS PCT: 1 %
Basophils Absolute: 98 cells/uL (ref 0–200)
EOS PCT: 1 %
Eosinophils Absolute: 98 cells/uL (ref 15–500)
HCT: 44.6 % (ref 38.5–50.0)
HEMOGLOBIN: 15.1 g/dL (ref 13.2–17.1)
LYMPHS ABS: 3038 {cells}/uL (ref 850–3900)
LYMPHS PCT: 31 %
MCH: 31.7 pg (ref 27.0–33.0)
MCHC: 33.9 g/dL (ref 32.0–36.0)
MCV: 93.7 fL (ref 80.0–100.0)
MPV: 10.5 fL (ref 7.5–12.5)
Monocytes Absolute: 686 cells/uL (ref 200–950)
Monocytes Relative: 7 %
NEUTROS PCT: 60 %
Neutro Abs: 5880 cells/uL (ref 1500–7800)
PLATELETS: 279 10*3/uL (ref 140–400)
RBC: 4.76 MIL/uL (ref 4.20–5.80)
RDW: 14.1 % (ref 11.0–15.0)
WBC: 9.8 10*3/uL (ref 3.8–10.8)

## 2016-08-20 LAB — COMPLETE METABOLIC PANEL WITH GFR
ALBUMIN: 4.4 g/dL (ref 3.6–5.1)
ALK PHOS: 68 U/L (ref 40–115)
ALT: 10 U/L (ref 9–46)
AST: 21 U/L (ref 10–35)
BUN: 12 mg/dL (ref 7–25)
CALCIUM: 9.6 mg/dL (ref 8.6–10.3)
CO2: 23 mmol/L (ref 20–31)
Chloride: 104 mmol/L (ref 98–110)
Creat: 0.83 mg/dL (ref 0.70–1.25)
GFR, Est Non African American: >89 mL/min (ref >=60)
Glucose, Bld: 87 mg/dL (ref 65–99)
POTASSIUM: 4.4 mmol/L (ref 3.5–5.3)
Sodium: 141 mmol/L (ref 135–146)
Total Bilirubin: 0.5 mg/dL (ref 0.2–1.2)
Total Protein: 7.3 g/dL (ref 6.1–8.1)

## 2016-08-20 LAB — LIPID PANEL
Cholesterol: 291 mg/dL — ABNORMAL HIGH (ref 125–200)
HDL: 38 mg/dL — ABNORMAL LOW (ref >=40)
LDL CALC: 220 mg/dL — AB (ref <130)
Total CHOL/HDL Ratio: 7.7 Ratio — ABNORMAL HIGH (ref <=5.0)
Triglycerides: 165 mg/dL — ABNORMAL HIGH (ref <150)
VLDL: 33 mg/dL — ABNORMAL HIGH (ref <30)

## 2016-08-20 LAB — HEMOGLOBIN A1C
HEMOGLOBIN A1C: 5.1 % (ref <5.7)
MEAN PLASMA GLUCOSE: 100 mg/dL

## 2016-08-20 LAB — VITAMIN B12: VITAMIN B 12: 299 pg/mL (ref 200–1100)

## 2016-08-20 LAB — LDL CHOLESTEROL, DIRECT: Direct LDL: 246 mg/dL — ABNORMAL HIGH (ref <130)

## 2016-08-20 LAB — TSH: TSH: 7.22 m[IU]/L — AB (ref 0.40–4.50)

## 2016-08-20 LAB — VITAMIN D 25 HYDROXY (VIT D DEFICIENCY, FRACTURES): VIT D 25 HYDROXY: 14 ng/mL — AB (ref 30–100)

## 2016-08-24 NOTE — Assessment & Plan Note (Signed)
In the past patient was on Symbyax (contains a combination of fluoxetine and olanzapine) which treated his depression. Patient denies a history of bipolar disorder

## 2016-08-24 NOTE — Assessment & Plan Note (Signed)
Used to be on Symbicort, then [employer].  Patient denies is a problem for him now.

## 2016-09-10 ENCOUNTER — Encounter: Payer: Self-pay | Admitting: Family Medicine

## 2016-09-10 ENCOUNTER — Ambulatory Visit (INDEPENDENT_AMBULATORY_CARE_PROVIDER_SITE_OTHER): Payer: Medicare Other | Admitting: Family Medicine

## 2016-09-10 ENCOUNTER — Telehealth: Payer: Self-pay | Admitting: Family Medicine

## 2016-09-10 VITALS — BP 118/82 | HR 66 | Ht 67.0 in | Wt 194.4 lb

## 2016-09-10 DIAGNOSIS — E781 Pure hyperglyceridemia: Secondary | ICD-10-CM

## 2016-09-10 DIAGNOSIS — E038 Other specified hypothyroidism: Secondary | ICD-10-CM

## 2016-09-10 DIAGNOSIS — R5383 Other fatigue: Secondary | ICD-10-CM

## 2016-09-10 DIAGNOSIS — Z91199 Patient's noncompliance with other medical treatment and regimen due to unspecified reason: Secondary | ICD-10-CM

## 2016-09-10 DIAGNOSIS — E559 Vitamin D deficiency, unspecified: Secondary | ICD-10-CM | POA: Diagnosis not present

## 2016-09-10 DIAGNOSIS — G894 Chronic pain syndrome: Secondary | ICD-10-CM

## 2016-09-10 DIAGNOSIS — M159 Polyosteoarthritis, unspecified: Secondary | ICD-10-CM

## 2016-09-10 DIAGNOSIS — E782 Mixed hyperlipidemia: Secondary | ICD-10-CM

## 2016-09-10 DIAGNOSIS — E063 Autoimmune thyroiditis: Secondary | ICD-10-CM

## 2016-09-10 DIAGNOSIS — E786 Lipoprotein deficiency: Secondary | ICD-10-CM

## 2016-09-10 DIAGNOSIS — F1210 Cannabis abuse, uncomplicated: Secondary | ICD-10-CM

## 2016-09-10 DIAGNOSIS — R7989 Other specified abnormal findings of blood chemistry: Secondary | ICD-10-CM

## 2016-09-10 DIAGNOSIS — E669 Obesity, unspecified: Secondary | ICD-10-CM | POA: Diagnosis not present

## 2016-09-10 DIAGNOSIS — Z8659 Personal history of other mental and behavioral disorders: Secondary | ICD-10-CM

## 2016-09-10 DIAGNOSIS — Z9119 Patient's noncompliance with other medical treatment and regimen: Secondary | ICD-10-CM

## 2016-09-10 MED ORDER — NIACIN ER (ANTIHYPERLIPIDEMIC) 1000 MG PO TBCR: 1000.0000 mg | EXTENDED_RELEASE_TABLET | Freq: Every day | ORAL | 1 refills | Status: DC

## 2016-09-10 MED ORDER — VITAMIN D (ERGOCALCIFEROL) 1.25 MG (50000 UNIT) PO CAPS: 50000.0000 [IU] | ORAL_CAPSULE | ORAL | 25 refills | Status: DC

## 2016-09-10 MED ORDER — ATORVASTATIN CALCIUM 80 MG PO TABS: 80.0000 mg | ORAL_TABLET | Freq: Every day | ORAL | 1 refills | Status: DC

## 2016-09-10 NOTE — Patient Instructions (Addendum)
Multivitamin daily, Vit D 5,000IU daily, B-complex vitamin daily,   - Recheck T3 and T4 in the near future.  Also recheck LFTs in's proximally 6 weeks or so after starting your cholesterol medicines.   The recommendation is you drink half of your weight in pounds equals the number of ounces of water a day you should drink. Hence, to her pound person she drink 100 ounces a day.   For those diagnosed with high triglycerides, it's important to take action to lower your levels and improve your heart health.  Triglyceride is just a fancy word for fat - the fat in our bodies is stored in the form of triglycerides. Triglycerides are found in foods and manufactured in our bodies.  Normal triglyceride levels are defined as less than 150 mg/dL; 150 to 199 is considered borderline high; 200 to 499 is high; and 500 or higher is officially called very high. To me, anything over 150 is a red flag indicating my patient needs to take immediate steps to get the situation under control.   What is the significance of high triglycerides? High triglyceride levels make blood thicker and stickier, which means that it is more likely to form clots. Studies have shown that triglyceride levels are associated with increased risks of cardiovascular disease and stroke - in both men and women - alone or in combination with other risk factors (high triglycerides combined with high LDL cholesterol can be a particularly deadly combination). For example, in one ground-breaking study, high triglycerides alone increased the risk of cardiovascular disease by 14 percent in men, and by 92 percent in women. But when the test subjects also had low HDL cholesterol (that's the good cholesterol) and other risk factors, high triglycerides increased the risk of disease by 32 percent in men and 76 percent in women.   Fortunately, triglycerides can sometimes be controlled with several diet and lifestyle changes.    What Factors Can Increase  Triglycerides? As with cholesterol, eating too much of the wrong kinds of fats will raise your blood triglycerides.  Therefore, it's important to restrict the amounts of saturated fats and trans fats you allow into your diet.  Triglyceride levels can also shoot up after eating foods that are high in carbohydrates or after drinking alcohol.  That's why triglyceride blood tests require an overnight fast.  If you have elevated triglycerides, it's especially important to avoid sugary and refined carbohydrates, including sugar, honey, and other sweeteners, soda and other sugary drinks, candy, baked goods, and anything made with white (refined or enriched) flour, including white bread, rolls, cereals, buns, pastries, regular pasta, and white rice.  You'll also want to limit dried fruit and fruit juice since they're dense in simple sugar.  All of these low-quality carbs cause a sudden rise in insulin, which may lead to a spike in triglycerides.  Triglycerides can also become elevated as a reaction to having diabetes, hypothyroidism, or kidney disease. As with most other heart-related factors, being overweight and inactive also contribute to abnormal triglycerides. And unfortunately, some people have a genetic predisposition that causes them to manufacture way too much triglycerides on their own, no matter how carefully they eat.   How Can You Lower Your Triglyceride Levels? If you are diagnosed with high triglycerides, it's important to take action. There are several things you can do to help lower your triglyceride levels and improve your heart health:  Lose weight if you are overweight.  There is a clear correlation between obesity and high  triglycerides - the heavier people are, the higher their triglyceride levels are likely to be. The good news is that losing weight can significantly lower triglycerides. In a large study of individuals with type 2 diabetes, those assigned to the "lifestyle intervention  group" - which involved counseling, a low-calorie meal plan, and customized exercise program - lost 8.6% of their body weight and lowered their triglyceride levels by more than 16%. If you're overweight, find a weight loss plan that works for you and commit to shedding the pounds and getting healthier.  Reduce the amount of saturated fat and trans fat in your diet.  Start by avoiding or dramatically limiting butter, cream cheese, lard, sour cream, doughnuts, cakes, cookies, candy bars, regular ice cream, fried foods, pizza, cheese sauce, cream-based sauces and salad dressings, high-fat meats (including fatty hamburgers, bologna, pepperoni, sausage, bacon, salami, pastrami, spareribs, and hot dogs), high-fat cuts of beef and pork, and whole-milk dairy products.   Other ways to cut back: Choose lean meats only (including skinless chicken and [place], lean beef, lean pork), fish, and reduced-fat or fat-free dairy products.   Experiment with adding whole soy foods to your diet. Although soy itself may not reduce risk of heart disease, it replaces hazardous animal fats with healthier proteins. Choose high-quality soy foods, such as tofu, tempeh, soy milk, and edamame (whole soybeans).  Always remove skin from poultry.  Prepare foods by baking, roasting, broiling, boiling, poaching, steaming, grilling, or stir-frying in vegetable oil.  Most stick margarines contain trans fats, and trans fats are also found in some packaged baked goods, potato chips, snack foods, fried foods, and fast food that use or create hydrogenated oils.    (All food labels must now list the amount of trans fats, right after the amount of saturated fats - good news for consumers. As a result, many food companies have now reformulated their products to be trans fat free.many, but not all! So it's still just as important to read labels and make sure the packaged foods you buy don't contain trans fats.)     If you use margarine, purchase  soft-tub margarine spreads that contain 0 grams trans fats and don't list any partially hydrogenated oils in the ingredients list. By substituting olive oil or vegetable oil for trans fats in just 2 percent of your daily calories, you can reduce your risk of heart disease by 53 percent.   There is no safe amount of trans fats, so try to keep them as far from your plate as possible.  Avoid foods that are concentrated in sugar (even dried fruit and fruit juice). Sugary foods can elevate triglyceride levels in the blood, so keep them to a bare minimum.  Swap out refined carbohydrates for whole grains.  Refined carbohydrates - like white rice, regular pasta, and anything made with white or "enriched" flour (including white bread, rolls, cereals, buns, and crackers) - raise blood sugar and insulin levels more than fiber-rich whole grains. Higher insulin levels, in turn, can lead to a higher rise in triglycerides after a meal. So, make the switch to whole wheat bread, whole grain pasta, brown or wild rice, and whole grain versions of cereals, crackers, and other bread products. However, it's important to know that individuals with high triglycerides should moderate even their intake of high-quality starches (since all starches raise blood sugar) - I recommend 1 to 2 servings per meal.  Cut way back on alcohol.  If you have high triglycerides, alcohol should be considered  a rare treat - if you indulge at all, since even small amounts of alcohol can dramatically increase triglyceride levels.  Incorporate omega-3 fats.  Heart-healthy fish oils are especially rich in omega-3 fatty acids. In multiple studies over the past two decades, people who ate diets high in omega-3s had 30 to 40 percent reductions in heart disease. Although we don't yet know why fish oil works so well, there are several possibilities. Omega-3s seem to reduce inflammation, reduce high blood pressure, decrease triglycerides, raise HDL  cholesterol, and make blood thinner and less sticky so it is less likely to clot. It's as close to a food prescription for heart health as it gets. If you have high triglycerides, I recommend eating at least three servings of one of the omega-3-rich fish every week (fatty fish is the most concentrated food form of omega three fats). If you cannot manage to eat that much fish, speak with your physician about taking fish oil capsules, which offer similar benefits.The best foods for omega-3 fatty acids include wild salmon (fresh, canned), herring, mackerel (not king), sardines, anchovies, rainbow trout, and Pacific oysters. Non-fish sources of omega-3 fats include omega-3-fortified eggs, ground flaxseed, chia seeds, walnuts, butternuts (white walnuts), seaweed, walnut oil, canola oil, and soybeans.  Quit smoking.  Smoking causes inflammation, not just in your lungs, but throughout your body. Inflammation can contribute to atherosclerosis, blood clots, and risk of heart attack. Smoking makes all heart health indicators worse. If you have high cholesterol, high triglycerides, or high blood pressure, smoking magnifies the danger.  Become more physically active.  Even moderate exercise can help improve cholesterol, triglycerides, and blood pressure. Aerobic exercise seems to be able to stop the sharp rise of triglycerides after eating, perhaps because of a decrease in the amount of triglyceride released by the liver, or because active muscle clears triglycerides out of the blood stream more quickly than inactive muscle. If you haven't exercised regularly (or at all) for years, I recommend starting slowly, by walking at an easy pace for 15 minutes a day. Then, as you feel more comfortable, increase the amount. Your ultimate goal should be at least 30 minutes of moderate physical activity, at least five days a week.    Guidelines for a Low Cholesterol, Low Saturated Fat Diet   Fats - Limit total intake of fats  and oils. - Avoid butter, stick margarine, shortening, lard, palm and coconut oils. - Limit mayonnaise, salad dressings, gravies and sauces, unless they are homemade with low-fat ingredients. - Limit chocolate. - Choose low-fat and nonfat products, such as low-fat mayonnaise, low-fat or non-hydrogenated peanut butter, low-fat or fat-free salad dressings and nonfat gravy. - Use vegetable oil, such as canola or olive oil. - Look for margarine that does not contain trans fatty acids. - Use nuts in moderate amounts. - Read ingredient labels carefully to determine both amount and type of fat present in foods. Limit saturated and trans fats! - Avoid high-fat processed and convenience foods.  Meats and Meat Alternatives - Choose fish, chicken, [place] and lean meats. - Use dried beans, peas, lentils and tofu. - Limit egg yolks to three to four per week. - If you eat red meat, limit to no more than three servings per week and choose loin or round cuts. - Avoid fatty meats, such as bacon, sausage, franks, luncheon meats and ribs. - Avoid all organ meats, including liver.  Dairy - Choose nonfat or low-fat milk, yogurt and cottage cheese. - Most cheeses are high  in fat. Choose cheeses made from non-fat milk, such as mozzarella and ricotta cheese. - Choose light or fat-free cream cheese and sour cream. - Avoid cream and sauces made with cream.  Fruits and Vegetables - Eat a wide variety of fruits and vegetables. - Use lemon juice, vinegar or "mist" olive oil on vegetables. - Avoid adding sauces, fat or oil to vegetables.  Breads, Cereals and Grains - Choose whole-grain breads, cereals, pastas and rice. - Avoid high-fat snack foods, such as granola, cookies, pies, pastries, doughnuts and croissants.  Cooking Tips - Avoid deep fried foods. - Trim visible fat off meats and remove skin from poultry before cooking. - Bake, broil, boil, poach or roast poultry, fish and lean meats. - Drain and  discard fat that drains out of meat as you cook it. - Add little or no fat to foods. - Use vegetable oil sprays to grease pans for cooking or baking. - Steam vegetables. - Use herbs or no-oil marinades to flavor foods.

## 2016-09-10 NOTE — Telephone Encounter (Signed)
LVM for pt requesting that he contact his insurance company to confirm which statins they will cover and then inform our office, per Dr. Raliegh Scarlet.
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
## 2016-09-10 NOTE — Telephone Encounter (Signed)
Patient stated cholesterol med is going to be $200, and wants a cheaper alternative

## 2016-09-11 MED ORDER — LOVASTATIN 40 MG PO TABS: 40.0000 mg | ORAL_TABLET | Freq: Every day | ORAL | 0 refills | Status: DC

## 2016-09-11 NOTE — Addendum Note (Signed)
Addended by: Fonnie Mu on: 09/11/2016 09:33 AM   Modules accepted: Orders

## 2016-09-11 NOTE — Telephone Encounter (Signed)
Pt called stating that his insurance company will pay for Mevacor, Pravachol, Lescol for his cholesterol.  His copay would only be $2 for 30 day supply or $6 for 90 day supply.

## 2016-09-13 DIAGNOSIS — E786 Lipoprotein deficiency: Secondary | ICD-10-CM | POA: Insufficient documentation

## 2016-09-13 DIAGNOSIS — Z9119 Patient's noncompliance with other medical treatment and regimen: Secondary | ICD-10-CM | POA: Insufficient documentation

## 2016-09-13 DIAGNOSIS — Z91199 Patient's noncompliance with other medical treatment and regimen due to unspecified reason: Secondary | ICD-10-CM | POA: Insufficient documentation

## 2016-09-13 NOTE — Assessment & Plan Note (Signed)
Advised patient to talk to his physicians [name] and [name] who had been treating him for his back pain and other chronic joint pains about further pain management.

## 2016-09-13 NOTE — Assessment & Plan Note (Signed)
Routine of regular moderate intensity aerobic activity daily

## 2016-09-13 NOTE — Assessment & Plan Note (Addendum)
Extensive counseling done regarding need for medicine.  Patient with a 10+ score of 10 year ASCVD risk  - Meds prescribed--> atorvastatin   - Advised to stop smoking marijuana - Diet and lifestyle modification

## 2016-09-13 NOTE — Assessment & Plan Note (Signed)
TSH elevated to 7.22.   We'll obtain free T3 and free T4.    Patient does not want to take any medicines unless he absolutely has to.  History of noncompliance with Dr. [name] and his Synthroid in the past

## 2016-09-13 NOTE — Assessment & Plan Note (Addendum)
Niacin CR given.   Handouts provided as well as counseling performed

## 2016-09-13 NOTE — Assessment & Plan Note (Addendum)
Follow-up with Ortho and your neurosurgeon

## 2016-09-13 NOTE — Assessment & Plan Note (Signed)
Patient denies depression. 

## 2016-09-13 NOTE — Assessment & Plan Note (Signed)
Abstinence advised

## 2016-09-13 NOTE — Assessment & Plan Note (Deleted)
We'll obtain free T3 and free T4.  Patient does not want to take any medicines unless he absolutely has to.

## 2016-09-13 NOTE — Assessment & Plan Note (Signed)

## 2016-09-13 NOTE — Assessment & Plan Note (Addendum)
Advised weekly and daily doses; counseling done

## 2016-09-15 ENCOUNTER — Other Ambulatory Visit: Payer: Self-pay

## 2016-09-15 MED ORDER — OMEGA-3-ACID ETHYL ESTERS 1 G PO CAPS: 2.0000 g | ORAL_CAPSULE | Freq: Two times a day (BID) | ORAL | 1 refills | Status: DC

## 2016-09-15 NOTE — Progress Notes (Signed)
Pharmacy called stating that pt's insurance did cover the atorvastatin so they cancelled the Mevacor.  It was the Niacin that was going to cost the pt $200/mo.  Lovaza sent to pharmacy, however pharmacist states that this medication will cost the pt $100/mo and that he will not pay this either.  Pt also stated to pharmacist that he thought we were going to send in RX for thyroid replacement.  Per Dr. Raliegh Scarlet, pt stated that he refused thyroid replacement at last OV.  Also, will have pt to try OTC slow release Niacin 500mg  nightly and then increase every 7 days by 500mg  daily until reaching a dose of 2,000mg  per day.  Charyl Bigger, CMA

## 2016-09-16 ENCOUNTER — Telehealth: Payer: Self-pay

## 2016-09-16 NOTE — Telephone Encounter (Signed)
Pt informed that thyroid medication was not sent to pharmacy because he previously informed Dr. Raliegh Scarlet that he did not want to go on thyroid medication.  Also informed pt of cost of Niacin and Lovaza and he states that he will not pay that much out of pocket for either of these medications.  Advised pt to take OTC slow release niacin at 500mg  for the first week and then increase by one tablet every week until he reaches 2000mg  daily.  Pt expressed understanding and is agreeable.  Charyl Bigger, CMA

## 2016-09-17 ENCOUNTER — Other Ambulatory Visit (INDEPENDENT_AMBULATORY_CARE_PROVIDER_SITE_OTHER): Payer: Medicare Other

## 2016-09-17 DIAGNOSIS — E038 Other specified hypothyroidism: Secondary | ICD-10-CM | POA: Diagnosis not present

## 2016-09-17 DIAGNOSIS — E781 Pure hyperglyceridemia: Secondary | ICD-10-CM | POA: Diagnosis not present

## 2016-09-17 DIAGNOSIS — E063 Autoimmune thyroiditis: Secondary | ICD-10-CM

## 2016-09-17 DIAGNOSIS — E782 Mixed hyperlipidemia: Secondary | ICD-10-CM

## 2016-09-18 LAB — T4, FREE: Free T4: 0.9 ng/dL (ref 0.8–1.8)

## 2016-09-18 LAB — T3, FREE: T3 FREE: 2.6 pg/mL (ref 2.3–4.2)

## 2016-09-18 LAB — AST: AST: 19 U/L (ref 10–35)

## 2016-09-18 LAB — ALT: ALT: 9 U/L (ref 9–46)

## 2016-10-28 ENCOUNTER — Ambulatory Visit (INDEPENDENT_AMBULATORY_CARE_PROVIDER_SITE_OTHER): Payer: Medicare Other | Admitting: Family Medicine

## 2016-10-28 ENCOUNTER — Encounter: Payer: Self-pay | Admitting: Family Medicine

## 2016-10-28 ENCOUNTER — Other Ambulatory Visit: Payer: Self-pay

## 2016-10-28 VITALS — BP 137/91 | HR 68 | Ht 67.0 in | Wt 195.5 lb

## 2016-10-28 DIAGNOSIS — E291 Testicular hypofunction: Secondary | ICD-10-CM

## 2016-10-28 DIAGNOSIS — E039 Hypothyroidism, unspecified: Secondary | ICD-10-CM | POA: Diagnosis not present

## 2016-10-28 DIAGNOSIS — G894 Chronic pain syndrome: Secondary | ICD-10-CM

## 2016-10-28 DIAGNOSIS — F4323 Adjustment disorder with mixed anxiety and depressed mood: Secondary | ICD-10-CM

## 2016-10-28 DIAGNOSIS — F1210 Cannabis abuse, uncomplicated: Secondary | ICD-10-CM | POA: Diagnosis not present

## 2016-10-28 DIAGNOSIS — E782 Mixed hyperlipidemia: Secondary | ICD-10-CM

## 2016-10-28 DIAGNOSIS — E786 Lipoprotein deficiency: Secondary | ICD-10-CM

## 2016-10-28 DIAGNOSIS — R5383 Other fatigue: Secondary | ICD-10-CM

## 2016-10-28 MED ORDER — ROSUVASTATIN CALCIUM 20 MG PO TABS: 20.0000 mg | ORAL_TABLET | Freq: Every day | ORAL | 0 refills | Status: DC

## 2016-10-28 MED ORDER — GABAPENTIN 800 MG PO TABS: 800.0000 mg | ORAL_TABLET | Freq: Three times a day (TID) | ORAL | 1 refills | Status: DC

## 2016-10-28 MED ORDER — FLUOXETINE HCL 20 MG PO TABS: ORAL_TABLET | ORAL | 0 refills | Status: DC

## 2016-10-28 NOTE — Assessment & Plan Note (Signed)
Patient wanting to check testosterone level today as he has a history of hypogonadism. We'll obtain level today.
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
## 2016-10-28 NOTE — Assessment & Plan Note (Signed)
Pt refuses to see any other docs for pain mgt at this time.  Reminded I cannot prescribe chronic narcotics or benzos etc  - start Neurontin after R/B meds d/c pt and all Q's answered.  Tol wife's well in past w/o S-E  - f/up 6 wks- reck

## 2016-10-28 NOTE — Assessment & Plan Note (Signed)
Extensive counseling done regarding need for medicine.  Patient with a 10+ score of 10 year ASCVD risk  - Meds prescribed--> pt desires to try crestor; D/C lipitor   - Advised to stop smoking marijuana - drink MORE WATER - Diet and lifestyle modification

## 2016-10-28 NOTE — Assessment & Plan Note (Signed)
Move more/ exercise of wlaking

## 2016-10-28 NOTE — Assessment & Plan Note (Signed)
Start 20 mg Prozac. Take this daily. After 1 week, if tolerating well, increase to 2 tablets daily. We'll see him back in approximately 92month.

## 2016-10-28 NOTE — Assessment & Plan Note (Signed)
Discussed the patient how marijuana is a depressant. Advised him cut back on that as we start him on Prozac for depression.

## 2016-10-28 NOTE — Assessment & Plan Note (Signed)
History of heart hypogonadism, will check testosterone level today. Patient wants to be treated with injections weekly if he is low.

## 2016-10-28 NOTE — Patient Instructions (Signed)
You'll need to recheck your liver enzymes in 6 weeks. This is after starting the Crestor today.    Also if the Crestor causes any muscle or joint aches, try taking a half a tablet nightly.  I advised a counselor which is always great for mood disorders. Counseling, exercise and medications together will be much more effective in treating any mood disorder than anyone alone.    Chronic Pain, Adult Chronic pain is a type of pain that lasts or keeps coming back (recurs) for at least six months. You may have chronic headaches, abdominal pain, or body pain. Chronic pain may be related to an illness, such as fibromyalgia or complex regional pain syndrome. Sometimes the cause of chronic pain is not known. Chronic pain can make it hard for you to do daily activities. If not treated, chronic pain can lead to other health problems, including anxiety and depression. Treatment depends on the cause and severity of your pain. You may need to work with a pain specialist to come up with a treatment plan. The plan may include medicine, counseling, and physical therapy. Many people benefit from a combination of two or more types of treatment to control their pain. Follow these instructions at home: Lifestyle  Consider keeping a pain diary to share with your health care providers.  Consider talking with a mental health care provider (psychologist) about how to cope with chronic pain.  Consider joining a chronic pain support group.  Try to control or lower your stress levels. Talk to your health care provider about strategies to do this. General instructions  Take over-the-counter and prescription medicines only as told by your health care provider.  Follow your treatment plan as told by your health care provider. This may include:  Gentle, regular exercise.  Eating a healthy diet that includes foods such as vegetables, fruits, fish, and lean meats.  Cognitive or behavioral therapy.  Working with a  Community education officer.  Meditation or yoga.  Acupuncture or massage therapy.  Aroma, color, light, or sound therapy.  Local electrical stimulation.  Shots (injections) of numbing or pain-relieving medicines into the spine or the area of pain.  Check your pain level as told by your health care provider. Ask your health care provider if you should use a pain scale.  Learn as much as you can about how to manage your chronic pain. Ask your health care provider if an intensive pain rehabilitation program or a chronic pain specialist would be helpful.  Keep all follow-up visits as told by your health care provider. This is important. Contact a health care provider if:  Your pain gets worse.  You have new pain.  You have trouble sleeping.  You have trouble doing your normal activities.  Your pain is not controlled with treatment.  Your have side effects from pain medicine.  You feel weak. Get help right away if:  You lose feeling or have numbness in your body.  You lose control of bowel or bladder function.  Your pain suddenly gets much worse.  You develop shaking or chills.  You develop confusion.  You develop chest pain.  You have trouble breathing or shortness of breath.  You pass out.  You have thoughts about hurting yourself or others. This information is not intended to replace advice given to you by your health care provider. Make sure you discuss any questions you have with your health care provider. Document Released: 07/26/2002 Document Revised: 07/03/2016 Document Reviewed: 04/22/2016 Elsevier Interactive Patient Education  2017 [employer]   

## 2016-10-28 NOTE — Assessment & Plan Note (Signed)
Discussed with patient we will check his T3 and free T4 and TSH today. If they are abnormal we will start thyroid medicine otherwise, patient would like to avoid medications if it's just subclinical hypothyroidism.

## 2016-10-29 ENCOUNTER — Telehealth: Payer: Self-pay

## 2016-10-29 LAB — TSH: TSH: 10.71 m[IU]/L — AB (ref 0.40–4.50)

## 2016-10-29 LAB — TESTOSTERONE TOTAL,FREE,BIO, MALES
Albumin: 4.4 g/dL (ref 3.6–5.1)
SEX HORMONE BINDING: 33 nmol/L (ref 22–77)
TESTOSTERONE BIOAVAILABLE: 79 ng/dL — AB (ref 110.0–575.0)
TESTOSTERONE FREE: 39.2 pg/mL — AB (ref 46.0–224.0)
TESTOSTERONE: 307 ng/dL (ref 250–827)

## 2016-10-29 LAB — T3, FREE: T3, Free: 2.7 pg/mL (ref 2.3–4.2)

## 2016-10-29 LAB — T4, FREE: Free T4: 0.9 ng/dL (ref 0.8–1.8)

## 2016-10-29 NOTE — Telephone Encounter (Signed)
Pt called stating that the new cholesterol medication is too expensive and he will not be getting it filled.  Charyl Bigger, CMA

## 2016-11-27 ENCOUNTER — Ambulatory Visit (INDEPENDENT_AMBULATORY_CARE_PROVIDER_SITE_OTHER): Payer: Medicare Other | Admitting: Family Medicine

## 2016-11-27 ENCOUNTER — Encounter: Payer: Self-pay | Admitting: Family Medicine

## 2016-11-27 VITALS — BP 131/87 | HR 62 | Ht 67.0 in | Wt 191.5 lb

## 2016-11-27 DIAGNOSIS — E782 Mixed hyperlipidemia: Secondary | ICD-10-CM

## 2016-11-27 DIAGNOSIS — E291 Testicular hypofunction: Secondary | ICD-10-CM | POA: Diagnosis not present

## 2016-11-27 DIAGNOSIS — F1910 Other psychoactive substance abuse, uncomplicated: Secondary | ICD-10-CM | POA: Diagnosis not present

## 2016-11-27 DIAGNOSIS — F4323 Adjustment disorder with mixed anxiety and depressed mood: Secondary | ICD-10-CM | POA: Diagnosis not present

## 2016-11-27 DIAGNOSIS — E559 Vitamin D deficiency, unspecified: Secondary | ICD-10-CM | POA: Diagnosis not present

## 2016-11-27 DIAGNOSIS — Z8659 Personal history of other mental and behavioral disorders: Secondary | ICD-10-CM

## 2016-11-27 DIAGNOSIS — R5383 Other fatigue: Secondary | ICD-10-CM | POA: Diagnosis not present

## 2016-11-27 NOTE — Assessment & Plan Note (Deleted)
Did discuss with patient that T levels 307 recently.   In the past it was 180 which is why he went on medicines.  After discussion we will recheck in 4 months and if it goes below 300 -then he will take his home testosterone that he already has

## 2016-11-27 NOTE — Progress Notes (Signed)
Impression and Recommendations:    1. Substance abuse   2. Other fatigue   3. Hypogonadism male   4. Adjustment disorder with mixed anxiety and depressed mood   5. Vitamin D deficiency   6. History of depression   7. h/o Mixed hyperlipidemia    Adjustment d/o:  Since the Prozac is giving little effect now take it for another 2-3 weeks for a total of 8 weeks and if still you feel little effect on it, then please titrate this off as there is no sense in taking a medicine if it is not doing anything to help you.  Wean down every week by week by half of your dose.  Make sure this medicine is weaned.  Fatigue and h/o hypogonadism:  - Did discuss with patient that T levels 307 recently.   180 in past which is why he went on T injections.    - After discussion we will recheck in 4 months and if it goes below 300 -then he will take his home testosterone that he already has  Chronic pain:  Also I recommend you increase your Neurontin to take it as directed. This may definitely help get she more than 30% improvement with your pain.  HLD:   10 yr risk score over 10--> d/c pt need for intensive diet and lifestyle modification since pt unable to pay for meds/ intol to meds - reck 4-9mo   The patient was counseled, risk factors were discussed, anticipatory guidance given.  Gross side effects, risk and benefits, and alternatives of medications and treatment plan in general discussed with patient.  Patient is aware that all medications have potential side effects and we are unable to predict every side effect or drug-drug interaction that may occur.   Patient will call with any questions prior to using medication if they have concerns.  Expresses verbal understanding and consents to current therapy and treatment regimen.  No barriers to understanding were identified.  Red flag symptoms and signs discussed in detail.  Patient expressed understanding regarding what to do in case of  emergency\urgent symptoms  Please see AVS handed out to patient at the end of our visit for further patient instructions/ counseling done pertaining to today's office visit.   Return in about 4 months (around 03/27/2017) for Follow-up on mood and pain- titrate Neurontin up;Check vit D, testosterone next OV.     Note: This document was prepared using Dragon voice recognition software and may include unintentional dictation errors.  [name] 9:35 PM --------------------------------------------------------------------------------------------------------------------------------------------------------------------------------------------------------------------------------------------        Subjective:    CC:  Chief Complaint  Patient presents with  . Anxiety  . Depression  . Hyperlipidemia    HPI: [name] Haney is a 66 y.o. male who presents to [place] [facility] today for issues as discussed below.  Concerns:   HLD:  - Patient states that the Lovaza, niacin and including over-the-counter niacin was too expensive for him.   He did not tolerate the Crestor due to muscle aches and pains.    -- He declines going to a cardiologist or going to seek other avenues since cost is an issue.   Pt conveys he does not exercise or eat well.    The cholesterol last visit was:  Lab Results  Component Value Date   CHOL 291 (H) 08/19/2016   HDL 38 (L) 08/19/2016   LDLCALC 220 (H) 08/19/2016   LDLDIRECT 246 (H)  08/19/2016   TRIG 165 (H) 08/19/2016   CHOLHDL 7.7 (H) 08/19/2016    Mood-  Started on prozac- notices no difference- up to 2 tabs daily.  Has been on the medicine 4 weeks now.  Tol well  Depression screen [facility] 2/9 11/27/2016 08/19/2016  Decreased Interest 1 0  Down, Depressed, Hopeless 0 1  PHQ - 2 Score 1 1  Altered sleeping 1 -  Tired, decreased energy 1 -  Change in appetite 1 -  Feeling bad or failure about yourself  0 -  Trouble  concentrating 0 -  Moving slowly or fidgety/restless 0 -  Suicidal thoughts 0 -  PHQ-9 Score 4 -   GAD 7 : Generalized Anxiety Score 11/27/2016  Nervous, Anxious, on Edge 0  Control/stop worrying 0  Worry too much - different things 0  Trouble relaxing 0  Restless 0  Easily annoyed or irritable 0  Afraid - awful might happen 0  Total GAD 7 Score 0  Anxiety Difficulty Not difficult at all    Marijuana abuse: Smoking daily.  Has not cut back- understands this medicine can cause mood instability.   Chronic pain- - started Neurontin last OV-- helps him sleep at night and shoulder pain not as bad.   Had seen Dr Thea Alken in past- Ortho.    Pt doesn't like taking pills, so only taking it once daily at night--> not 3/ times daily.  - Pain is 30% decreased on current medications.   Hypogonadism male Did discuss with patient that T levels 307 recently.   In the past it was 180 which is why he went on medicines.  Pt c/o some fatigue but does not exercise and smokes pot      Patient Care Team    Relationship Specialty Notifications Start End  [name], DO PCP - General Family Medicine  09/10/16   Specialists, [name]  Orthopedic Surgery  08/19/16   [name], MD Consulting Physician Orthopedic Surgery  08/19/16    Comment: [name], MD Consulting Physician Neurosurgery  09/13/16      Patient Active Problem List   Diagnosis Date Noted  . h/o Mixed hyperlipidemia 08/19/2016    Priority: High  . h/o Hypertriglyceridemia 08/19/2016    Priority: High  . Obesity, Class I, BMI 30-34.9 08/19/2016    Priority: High  . Marijuana abuse 02/21/2014    Priority: High  . Chronic pain syndrome 02/25/2013    Priority: High  . Hypothyroidism 04/25/2011    Priority: High  . Generalized OA- multiple joints 03/12/2009    Priority: High  . Low HDL (under 40) 09/13/2016    Priority: Medium  . Low back pain 04/18/2011    Priority: Medium  . History  of depression 03/12/2009    Priority: Medium  . Personal history of noncompliance with medical treatment, presenting hazards to health 09/13/2016    Priority: Low  . Vitamin D deficiency 09/10/2016    Priority: Low  . External otitis of right ear 01/27/2017  . Adjustment disorder with mixed anxiety and depressed mood 10/28/2016  . Substance abuse 08/19/2016  . Other fatigue 08/19/2016  . Need for prophylactic vaccination against Streptococcus pneumoniae (pneumococcus) 02/21/2014  . BPH (benign prostatic hyperplasia) 02/27/2013  . COLONIC POLYPS, HX OF 02/25/2013  . Routine general medical examination at a health care facility 01/15/2012  . Screening, ischemic heart disease 01/15/2012  . Hypogonadism male 04/25/2011  . ED (erectile dysfunction) 04/18/2011  . Personal history  of asthma 03/12/2009  . Peptic ulcer 03/12/2009    Past Medical history, Surgical history, Family history, Social history, Allergies and Medications have been entered into the medical record, reviewed and changed as needed.    Current Meds  Medication Sig  . Vitamin D, Ergocalciferol, (DRISDOL) 50000 units CAPS capsule Take 1 capsule (50,000 Units total) by mouth every 7 (seven) days. (Patient taking differently: Take 50,000 Units by mouth every Wednesday. )  . [DISCONTINUED] FLUoxetine (PROZAC) 20 MG tablet 1 tablet daily for 2 weeks then increase to 2 tabs daily  . [DISCONTINUED] gabapentin (NEURONTIN) 800 MG tablet Take 1 tablet (800 mg total) by mouth 3 (three) times daily.    Allergies:  Allergies  Allergen Reactions  . Avelox [Moxifloxacin Hcl In Nacl]     UNSPECIFIED REACTION      Objective:   Blood pressure 131/87, pulse 62, height 5\' 7"  (1.702 m), weight 191 lb 8 oz (86.9 kg). Body mass index is 29.99 kg/m. General:  Well Developed, well nourished, appropriate for stated age.  Neuro:  Alert and oriented,  extra-ocular muscles intact  HEENT:  Normocephalic, atraumatic, neck supple, no carotid  bruits appreciated  Skin:  no gross rash, warm, pink. Cardiac:  RRR, S1 S2 Respiratory:  ECTA B/L and A/P, Not using accessory muscles, speaking in full sentences- unlabored. Vascular:  Ext warm, no cyanosis apprec.; cap RF less 2 sec. Psych:  No HI/SI, judgement and insight good, Euthymic mood. Full Affect.      Patient Care Team    Relationship Specialty Notifications Start End  [name], DO PCP - General Family Medicine  09/10/16   Specialists, [name]  Orthopedic Surgery  08/19/16   [name], MD Consulting Physician Orthopedic Surgery  08/19/16    Comment: [name], MD Consulting Physician Neurosurgery  09/13/16     Review of Systems  Constitutional: Positive for malaise/fatigue. Negative for chills, diaphoresis, fever and weight loss.       Fatigued, tired  HENT: Negative for nosebleeds.   Eyes: Negative for blurred vision and double vision.  Respiratory: Negative for shortness of breath and wheezing.   Cardiovascular: Negative for chest pain, palpitations, orthopnea and claudication.  Gastrointestinal: Negative for diarrhea, nausea and vomiting.  Musculoskeletal: Positive for back pain, joint pain and myalgias. Negative for falls.  Skin: Negative for rash.  Neurological: Negative for dizziness and focal weakness.  Psychiatric/Behavioral: Positive for depression. Negative for memory loss and suicidal ideas. The patient is not nervous/anxious and does not have insomnia.

## 2016-11-27 NOTE — Patient Instructions (Signed)
Since the Prozac is giving little effect now take it for another 2-3 weeks for a total of 8 weeks and if still you feel little effect on it, then please titrate this off as there is no sense in taking a medicine if it is not doing anything to help you.  Wean down every week by week by half of your dose.  Make sure this medicine is weaned.  Also I recommend you increase your Neurontin to take it as directed. This may definitely help get she more than 30% improvement with your pain.

## 2017-01-05 ENCOUNTER — Telehealth: Payer: Self-pay | Admitting: Family Medicine

## 2017-01-05 DIAGNOSIS — H9193 Unspecified hearing loss, bilateral: Secondary | ICD-10-CM

## 2017-01-05 NOTE — Telephone Encounter (Signed)
Patient would like to have an audiologist referral for a hearing test

## 2017-01-07 NOTE — Addendum Note (Signed)
Addended by: Fonnie Mu on: 01/07/2017 07:59 AM   Modules accepted: Orders

## 2017-01-19 ENCOUNTER — Other Ambulatory Visit: Payer: Self-pay

## 2017-01-19 DIAGNOSIS — E291 Testicular hypofunction: Secondary | ICD-10-CM

## 2017-01-19 DIAGNOSIS — R5383 Other fatigue: Secondary | ICD-10-CM

## 2017-01-20 ENCOUNTER — Encounter: Payer: Self-pay | Admitting: Adult Health

## 2017-01-20 ENCOUNTER — Ambulatory Visit (INDEPENDENT_AMBULATORY_CARE_PROVIDER_SITE_OTHER): Payer: Medicare Other | Admitting: Adult Health

## 2017-01-20 VITALS — BP 127/84 | HR 63 | Ht 67.0 in | Wt 203.1 lb

## 2017-01-20 DIAGNOSIS — E039 Hypothyroidism, unspecified: Secondary | ICD-10-CM

## 2017-01-20 DIAGNOSIS — R5383 Other fatigue: Secondary | ICD-10-CM | POA: Diagnosis not present

## 2017-01-20 DIAGNOSIS — F1210 Cannabis abuse, uncomplicated: Secondary | ICD-10-CM | POA: Diagnosis not present

## 2017-01-20 DIAGNOSIS — E781 Pure hyperglyceridemia: Secondary | ICD-10-CM

## 2017-01-20 MED ORDER — LEVOTHYROXINE SODIUM 50 MCG PO TABS: 50.0000 ug | ORAL_TABLET | Freq: Every day | ORAL | 3 refills | Status: DC

## 2017-01-20 NOTE — Assessment & Plan Note (Signed)
Discussed at length the risks and negative health effects of cannabis use. Declined counseling, assistance with quitting at this time.

## 2017-01-20 NOTE — Patient Instructions (Addendum)
Hypothyroidism Hypothyroidism is a disorder of the thyroid. The thyroid is a large gland that is located in the lower front of the neck. The thyroid releases hormones that control how the body works. With hypothyroidism, the thyroid does not make enough of these hormones. What are the causes? Causes of hypothyroidism may include:  Viral infections.  Pregnancy.  Your own defense system (immune system) attacking your thyroid.  Certain medicines.  Birth defects.  Past radiation treatments to your head or neck.  Past treatment with radioactive iodine.  Past surgical removal of part or all of your thyroid.  Problems with the gland that is located in the center of your brain (pituitary). What are the signs or symptoms? Signs and symptoms of hypothyroidism may include:  Feeling as though you have no energy (lethargy).  Inability to tolerate cold.  Weight gain that is not explained by a change in diet or exercise habits.  Dry skin.  Coarse hair.  Menstrual irregularity.  Slowing of thought processes.  Constipation.  Sadness or depression. How is this diagnosed? Your health care provider may diagnose hypothyroidism with blood tests and ultrasound tests. How is this treated? Hypothyroidism is treated with medicine that replaces the hormones that your body does not make. After you begin treatment, it may take several weeks for symptoms to go away. Follow these instructions at home:  Take medicines only as directed by your health care provider.  If you start taking any new medicines, tell your health care provider.  Keep all follow-up visits as directed by your health care provider. This is important. As your condition improves, your dosage needs may change. You will need to have blood tests regularly so that your health care provider can watch your condition. Contact a health care provider if:  Your symptoms do not get better with treatment.  You are taking thyroid  replacement medicine and:  You sweat excessively.  You have tremors.  You feel anxious.  You lose weight rapidly.  You cannot tolerate heat.  You have emotional swings.  You have diarrhea.  You feel weak. Get help right away if:  You develop chest pain.  You develop an irregular heartbeat.  You develop a rapid heartbeat. This information is not intended to replace advice given to you by your health care provider. Make sure you discuss any questions you have with your health care provider. Document Released: 11/03/2005 Document Revised: 04/10/2016 Document Reviewed: 03/21/2014 Elsevier Interactive Patient Education  2017 [employer].  Started on Levothyroxine 72mcg daily. Continue Neurontin as needed. Since testosterone >300, does not need replacement. Continue to reduce saturated fat/sodium intake. Recommend to reduce-stop cannabis use. Please follow-up in May per Dr. Hershal Coria instructions.

## 2017-01-20 NOTE — Assessment & Plan Note (Signed)
Declined cholesterol medication at this time. Encouraged to decrease saturated fat intake and to continue regular movement.

## 2017-01-20 NOTE — Assessment & Plan Note (Signed)
Last TSH 10.7 10/28/16 Started on Levothyroxine 28mcg daily. Will re-check TSH level in 4 weeks.

## 2017-01-20 NOTE — Assessment & Plan Note (Signed)
Increase water intake, try to consume at least 100 ounces/daily. Practice good sleep hygiene.

## 2017-01-20 NOTE — Progress Notes (Signed)
Subjective:    Patient ID: [name] Haney, male    DOB: [birth year], 66 y.o.   MRN: [id number]  HPI :  [name] Haney is here annual wellness visit.  He reports generalized aches, adjustment disorder with anxiety/despression, insomnia, hypogonadism (last  Testosterone level was 307 in Dec 2017-per Dr. Raliegh Scarlet will not treat if > 300), fatigue, vit d deficiency, elevated cholesterol (did not fill Crestor due to cost), and hypothyroidism (last TSH 10.2 in Dec 2017).  He stoped taking the Fluoxetine 20mg  daily bc it "wasn't helping and why take a pill if it isn't going to help".  He continues use cannabis daily and does not have plans to quit anytime soon.  He also reports decreased hearing and "feels like something is stuck in my right ear". He as Audiology appt. At the end the month.  He generally does not want to take medication and does not participate in regular exercise ,however is currently renovating a home and "moves a lot when working on the house".    Patient Care Team    Relationship Specialty Notifications Start End  [name], DO PCP - General Family Medicine  09/10/16   [name]  Orthopedic Surgery  08/19/16   [name], MD Consulting Physician Orthopedic Surgery  08/19/16    Comment: [name], MD Consulting Physician Neurosurgery  09/13/16     Patient Active Problem List   Diagnosis Date Noted  . Adjustment disorder with mixed anxiety and depressed mood 10/28/2016  . Personal history of noncompliance with medical treatment, presenting hazards to health 09/13/2016  . Low HDL (under 40) 09/13/2016  . Vitamin D deficiency 09/10/2016  . Substance abuse 08/19/2016  . h/o Mixed hyperlipidemia 08/19/2016  . Other fatigue 08/19/2016  . h/o Hypertriglyceridemia 08/19/2016  . Obesity, Class I, BMI 30-34.9 08/19/2016  . Need for prophylactic vaccination against Streptococcus pneumoniae (pneumococcus) 02/21/2014  . Marijuana abuse  02/21/2014  . BPH (benign prostatic hyperplasia) 02/27/2013  . COLONIC POLYPS, HX OF 02/25/2013  . Chronic pain syndrome 02/25/2013  . Routine general medical examination at a health care facility 01/15/2012  . Screening, ischemic heart disease 01/15/2012  . Hypogonadism male 04/25/2011  . Hypothyroidism 04/25/2011  . Low back pain 04/18/2011  . ED (erectile dysfunction) 04/18/2011  . History of depression 03/12/2009  . Personal history of asthma 03/12/2009  . PEPTIC ULCER DISEASE 03/12/2009  . Generalized OA- multiple joints 03/12/2009     Past Medical History:  Diagnosis Date  . Arthritis   . Asthma   . Depression   . Substance abuse   . Thyroid disease      Past Surgical History:  Procedure Laterality Date  . LUMBAR DISC SURGERY     x 2  . MEDIAL PARTIAL KNEE REPLACEMENT Right 2009  . NECK SURGERY  2006     Family History  Problem Relation Age of Onset  . Diabetes Mother 43  . Arthritis Mother   . Heart disease Mother 18  . Stroke Father 80    x 12     History  Drug Use  . Types: Marijuana     History  Alcohol Use No     History  Smoking Status  . Former Smoker  . Packs/day: 0.50  . Years: 15.00  . Types: Cigarettes  . Quit date: 11/18/1987  Smokeless Tobacco  . Never Used     Outpatient Encounter Prescriptions as of 01/20/2017  Medication Sig  . gabapentin (  NEURONTIN) 800 MG tablet Take 1,600 mg by mouth at bedtime.  . Vitamin D, Ergocalciferol, (DRISDOL) 50000 units CAPS capsule Take 1 capsule (50,000 Units total) by mouth every 7 (seven) days.  Marland Kitchen levothyroxine (SYNTHROID, LEVOTHROID) 50 MCG tablet Take 1 tablet (50 mcg total) by mouth daily.  . [DISCONTINUED] FLUoxetine (PROZAC) 20 MG tablet 1 tablet daily for 2 weeks then increase to 2 tabs daily  . [DISCONTINUED] gabapentin (NEURONTIN) 800 MG tablet Take 1 tablet (800 mg total) by mouth 3 (three) times daily.   No facility-administered encounter medications on file as of 01/20/2017.      Allergies: Avelox [moxifloxacin hcl in nacl]  Body mass index is 31.81 kg/m.  Blood pressure 127/84, pulse 63, height 5\' 7"  (1.702 m), weight 203 lb 1.6 oz (92.1 kg).  Review of Systems  Constitutional: Positive for fatigue. Negative for activity change, appetite change, chills, diaphoresis, fever and unexpected weight change.  HENT: Positive for hearing loss. Negative for congestion.   Eyes: Negative for visual disturbance.  Respiratory: Negative for cough, chest tightness and shortness of breath.   Cardiovascular: Negative for chest pain and leg swelling.  Gastrointestinal: Negative for abdominal distention, abdominal pain, constipation, diarrhea, nausea and vomiting.  Endocrine: Negative for cold intolerance, heat intolerance, polydipsia, polyphagia and polyuria.  Genitourinary: Negative for difficulty urinating.  Musculoskeletal: Positive for arthralgias, back pain and myalgias. Negative for gait problem.  Skin: Negative for color change, pallor, rash and wound.  Neurological: Negative for dizziness, tremors, weakness, light-headedness and numbness.  Psychiatric/Behavioral: Positive for sleep disturbance. Negative for agitation, behavioral problems, confusion and decreased concentration. The patient is not nervous/anxious.        Objective:   Physical Exam  Constitutional: He is oriented to person, place, and time. He appears well-developed and well-nourished. No distress.  HENT:  Head: Normocephalic and atraumatic.  Right Ear: There is swelling. No tenderness. Decreased hearing is noted.  Left Ear: No swelling. Decreased hearing is noted.  Nose: Mucosal edema present. Right sinus exhibits no maxillary sinus tenderness and no frontal sinus tenderness. Left sinus exhibits no maxillary sinus tenderness and no frontal sinus tenderness.  Mouth/Throat: Uvula is midline. Abnormal dentition.  Cerumen removed from right ear.  Eyes: Conjunctivae are normal. Pupils are equal, round,  and reactive to light.  Neck: Normal range of motion. Neck supple.  Cardiovascular: Normal rate, regular rhythm, normal heart sounds and intact distal pulses.   Pulmonary/Chest: Effort normal. No respiratory distress. He has decreased breath sounds in the right lower field and the left lower field. He has wheezes in the right lower field, the left middle field and the left lower field. He has no rales. He exhibits no tenderness.  Wheezing will clear with cough.  Abdominal: Soft. Bowel sounds are normal. He exhibits no distension and no mass. There is no tenderness. There is no rebound and no guarding.  Musculoskeletal: Normal range of motion.  Lymphadenopathy:    He has no cervical adenopathy.  Neurological: He is alert and oriented to person, place, and time.  Skin: Skin is warm and dry. No rash noted. He is not diaphoretic. No erythema. No pallor.  Psychiatric: He has a normal mood and affect. His behavior is normal. Judgment and thought content normal.  Nursing note and vitals reviewed.         Assessment & Plan:   1. Marijuana abuse   2. Other fatigue   3. Hypothyroidism, unspecified type   4. h/o Hypertriglyceridemia     Hypothyroidism  Last TSH 10.7 10/28/16 Started on Levothyroxine 11mcg daily. Will re-check TSH level in 4 weeks.  Other fatigue Increase water intake, try to consume at least 100 ounces/daily. Practice good sleep hygiene.  Marijuana abuse Discussed at length the risks and negative health effects of cannabis use. Declined counseling, assistance with quitting at this time.  h/o Hypertriglyceridemia Declined cholesterol medication at this time. Encouraged to decrease saturated fat intake and to continue regular movement.    FOLLOW-UP:  Return in about 2 months (around 03/22/2017) for Regular Follow Up.

## 2017-01-21 LAB — VITAMIN D 25 HYDROXY (VIT D DEFICIENCY, FRACTURES): Vit D, 25-Hydroxy: 26.9 ng/mL — ABNORMAL LOW (ref 30.0–100.0)

## 2017-01-27 ENCOUNTER — Ambulatory Visit (INDEPENDENT_AMBULATORY_CARE_PROVIDER_SITE_OTHER): Payer: Medicare Other | Admitting: Adult Health

## 2017-01-27 ENCOUNTER — Encounter: Payer: Self-pay | Admitting: Adult Health

## 2017-01-27 VITALS — BP 108/71 | HR 59 | Temp 98.6°F | Ht 67.0 in | Wt 203.3 lb

## 2017-01-27 DIAGNOSIS — G894 Chronic pain syndrome: Secondary | ICD-10-CM | POA: Diagnosis not present

## 2017-01-27 DIAGNOSIS — K279 Peptic ulcer, site unspecified, unspecified as acute or chronic, without hemorrhage or perforation: Secondary | ICD-10-CM

## 2017-01-27 DIAGNOSIS — H60501 Unspecified acute noninfective otitis externa, right ear: Secondary | ICD-10-CM

## 2017-01-27 DIAGNOSIS — Z8659 Personal history of other mental and behavioral disorders: Secondary | ICD-10-CM | POA: Diagnosis not present

## 2017-01-27 DIAGNOSIS — H6091 Unspecified otitis externa, right ear: Secondary | ICD-10-CM | POA: Insufficient documentation

## 2017-01-27 MED ORDER — DULOXETINE HCL 60 MG PO CPEP: 60.0000 mg | ORAL_CAPSULE | Freq: Every day | ORAL | 3 refills | Status: DC

## 2017-01-27 MED ORDER — CLOTRIMAZOLE 1 % EX SOLN: 1.0000 "application " | Freq: Two times a day (BID) | CUTANEOUS | 0 refills | Status: AC

## 2017-01-27 MED ORDER — RANITIDINE HCL 150 MG PO CAPS: 150.0000 mg | ORAL_CAPSULE | Freq: Two times a day (BID) | ORAL | 2 refills | Status: DC

## 2017-01-27 MED ORDER — CLOTRIMAZOLE 1 % EX SOLN: 1.0000 "application " | Freq: Two times a day (BID) | CUTANEOUS | 0 refills | Status: DC

## 2017-01-27 MED ORDER — TRAMADOL HCL 50 MG PO TABS: 50.0000 mg | ORAL_TABLET | Freq: Three times a day (TID) | ORAL | 0 refills | Status: DC | PRN

## 2017-01-27 NOTE — Patient Instructions (Signed)
Otitis Externa Otitis externa is an infection of the outer ear canal. The outer ear canal is the area between the outside of the ear and the eardrum. Otitis externa is sometimes called "swimmer's ear." What are the causes? This condition may be caused by:  Swimming in dirty water.  Moisture in the ear.  An injury to the inside of the ear.  An object stuck in the ear.  A cut or scrape on the outside of the ear. What increases the risk? This condition is more likely to develop in swimmers. What are the signs or symptoms? The first symptom of this condition is often itching in the ear. Later signs and symptoms include:  Swelling of the ear.  Redness in the ear.  Ear pain. The pain may get worse when you pull on your ear.  Pus coming from the ear. How is this diagnosed? This condition may be diagnosed by examining the ear and testing fluid from the ear for bacteria and funguses. How is this treated? This condition may be treated with:  Antibiotic ear drops. These are often given for 10-14 days.  Medicine to reduce itching and swelling. Follow these instructions at home:  If you were prescribed antibiotic ear drops, apply them as told by your health care provider. Do not stop using the antibiotic even if your condition improves.  Take over-the-counter and prescription medicines only as told by your health care provider.  Keep all follow-up visits as told by your health care provider. This is important. How is this prevented?  Keep your ear dry. Use the corner of a towel to dry your ear after you swim or bathe.  Avoid scratching or putting things in your ear. Doing these things can damage the ear canal or remove the protective wax that lines it, which makes it easier for bacteria and funguses to grow.  Avoid swimming in lakes, polluted water, or pools that may not have the right amount of chlorine.  Consider making ear drops and putting 3 or 4 drops in each ear after you  swim. Ask your health care provider about how you can make ear drops. Contact a health care provider if:  You have a fever.  After 3 days your ear is still red, swollen, painful, or draining pus.  Your redness, swelling, or pain gets worse.  You have a severe headache.  You have redness, swelling, pain, or tenderness in the area behind your ear. This information is not intended to replace advice given to you by your health care provider. Make sure you discuss any questions you have with your health care provider. Document Released: 11/03/2005 Document Revised: 12/11/2015 Document Reviewed: 08/13/2015 Elsevier Interactive Patient Education  2017 [employer].  Please use Lotrimin solution twice daily. Keep ear dry/clean. Please keep hands off/out of ear. Please call clinic with any questions/concerns or if ear dose not improve in 14 days.

## 2017-01-27 NOTE — Assessment & Plan Note (Signed)
Started on Cymbalta 60mg . If thoughts of harming yourself/others develop, stop medication and call clinic immediately.

## 2017-01-27 NOTE — Assessment & Plan Note (Signed)
Started on Cymbalta 60mg . Short Tramadol Rx provided. Advised to stop using Marijuana for pain control. Has f/u in May with Dr. Fredric Mare discuss further treatment options then.

## 2017-01-27 NOTE — Progress Notes (Signed)
Subjective:    Patient ID: [name] Haney, male    DOB: 08-Aug-1951, 66 y.o.   MRN: [id number]  HPI:  Mr. Crescenzo presents with right ear itching and decreased hearing.  He denies pain or recent trauma to right ear. Also requests medications for depression, chronic pain syndrome and Acid Reflux.  He recently had a home visit from [employer] NP and the following recommendations were offered for pain management and GI upset-Cymbalta, Tramadol, and Ranitidine. Depression started in 2006, denies thoughts of harming himself or others.  He denies ETOH or tobacco use, however uses daily marijuana.  Chronic pain:  Cervical neck (5/10-8/10, dull ache). Lumbar back (4/10-8/10, dull ache). Right knee (5/10-8/10, sharp localized pain). Pain been occurring for decades and flares up with repetitive use. GI sx's: Acid reflux, diarrhea, abdominal pain.   Patient Care Team    Relationship Specialty Notifications Start End  [name], DO PCP - General Family Medicine  09/10/16   [name]  Orthopedic Surgery  08/19/16   [name], MD Consulting Physician Orthopedic Surgery  08/19/16    Comment: [name], MD Consulting Physician Neurosurgery  09/13/16     Patient Active Problem List   Diagnosis Date Noted  . External otitis of right ear 01/27/2017  . Adjustment disorder with mixed anxiety and depressed mood 10/28/2016  . Personal history of noncompliance with medical treatment, presenting hazards to health 09/13/2016  . Low HDL (under 40) 09/13/2016  . Vitamin D deficiency 09/10/2016  . Substance abuse 08/19/2016  . h/o Mixed hyperlipidemia 08/19/2016  . Other fatigue 08/19/2016  . h/o Hypertriglyceridemia 08/19/2016  . Obesity, Class I, BMI 30-34.9 08/19/2016  . Need for prophylactic vaccination against Streptococcus pneumoniae (pneumococcus) 02/21/2014  . Marijuana abuse 02/21/2014  . BPH (benign prostatic hyperplasia) 02/27/2013  .  COLONIC POLYPS, HX OF 02/25/2013  . Chronic pain syndrome 02/25/2013  . Routine general medical examination at a health care facility 01/15/2012  . Screening, ischemic heart disease 01/15/2012  . Hypogonadism male 04/25/2011  . Hypothyroidism 04/25/2011  . Low back pain 04/18/2011  . ED (erectile dysfunction) 04/18/2011  . History of depression 03/12/2009  . Personal history of asthma 03/12/2009  . Peptic ulcer 03/12/2009  . Generalized OA- multiple joints 03/12/2009     Past Medical History:  Diagnosis Date  . Arthritis   . Asthma   . Depression   . Substance abuse   . Thyroid disease      Past Surgical History:  Procedure Laterality Date  . LUMBAR DISC SURGERY     x 2  . MEDIAL PARTIAL KNEE REPLACEMENT Right 2009  . NECK SURGERY  2006     Family History  Problem Relation Age of Onset  . Diabetes Mother 37  . Arthritis Mother   . Heart disease Mother 63  . Stroke Father 89    x 12     History  Drug Use  . Types: Marijuana     History  Alcohol Use No     History  Smoking Status  . Former Smoker  . Packs/day: 0.50  . Years: 15.00  . Types: Cigarettes  . Quit date: 11/18/1987  Smokeless Tobacco  . Never Used     Outpatient Encounter Prescriptions as of 01/27/2017  Medication Sig  . gabapentin (NEURONTIN) 800 MG tablet Take 1,600 mg by mouth at bedtime.  Marland Kitchen levothyroxine (SYNTHROID, LEVOTHROID) 50 MCG tablet Take 1 tablet (50 mcg total) by mouth daily.  Marland Kitchen  Vitamin D, Ergocalciferol, (DRISDOL) 50000 units CAPS capsule Take 1 capsule (50,000 Units total) by mouth every 7 (seven) days.  . clotrimazole (LOTRIMIN) 1 % external solution Apply 1 application topically 2 (two) times daily. Apply into ear canal and onto outer upper ear twice daily. Keep ear dry and clean. Keep hands off/out of ear.  . DULoxetine (CYMBALTA) 60 MG capsule Take 1 capsule (60 mg total) by mouth daily.  . ranitidine (ZANTAC) 150 MG capsule Take 1 capsule (150 mg total) by mouth 2  (two) times daily.  . traMADol (ULTRAM) 50 MG tablet Take 1 tablet (50 mg total) by mouth every 8 (eight) hours as needed.  . [DISCONTINUED] clotrimazole (LOTRIMIN) 1 % external solution Apply 1 application topically 2 (two) times daily. Apply into ear canal and onto outer upper ear twice daily. Keep ear dry and clean. Keep hands off/out of ear.   No facility-administered encounter medications on file as of 01/27/2017.     Allergies: Avelox [moxifloxacin hcl in nacl]  Body mass index is 31.84 kg/m.  Blood pressure 108/71, pulse (!) 59, temperature 98.6 F (37 C), temperature source Oral, height 5\' 7"  (1.702 m), weight 203 lb 4.8 oz (92.2 kg).  Review of Systems  Constitutional: Negative for activity change, appetite change, chills, diaphoresis, fatigue, fever and unexpected weight change.  HENT: Negative for congestion.   Eyes: Negative for visual disturbance.  Respiratory: Negative for cough, chest tightness, shortness of breath, wheezing and stridor.   Cardiovascular: Negative for chest pain, palpitations and leg swelling.  Gastrointestinal: Negative for abdominal distention, abdominal pain, blood in stool, constipation, diarrhea, nausea and vomiting.  Endocrine: Negative for cold intolerance, heat intolerance, polydipsia, polyphagia and polyuria.  Genitourinary: Negative for difficulty urinating and flank pain.  Musculoskeletal: Positive for arthralgias, back pain, joint swelling and myalgias. Negative for gait problem, neck pain and neck stiffness.  Skin: Negative for color change, pallor, rash and wound.  Neurological: Negative for light-headedness and headaches.  Psychiatric/Behavioral: Negative for agitation, behavioral problems, confusion, decreased concentration, dysphoric mood, hallucinations, self-injury, sleep disturbance and suicidal ideas. The patient is not nervous/anxious and is not hyperactive.        Objective:   Physical Exam  Constitutional: He is oriented to  person, place, and time. He appears well-developed and well-nourished. No distress.  HENT:  Head: Normocephalic and atraumatic.  Right Ear: No swelling or tenderness. Tympanic membrane is not erythematous and not bulging. Tympanic membrane mobility is normal. Decreased hearing is noted.  Left Ear: Hearing, tympanic membrane and external ear normal.  Nose: No mucosal edema.  Flaky skin noted on top of crease of scaphoid fossa. Yellow/white flakes noted inside ear canal.  Eyes: Conjunctivae are normal. Pupils are equal, round, and reactive to light.  Neck: Normal range of motion.  Cardiovascular: Normal rate, regular rhythm, normal heart sounds and intact distal pulses.   Pulmonary/Chest: Effort normal and breath sounds normal. No respiratory distress. He has no wheezes. He has no rales. He exhibits no tenderness.  Abdominal: Soft. Bowel sounds are normal. He exhibits no distension and no mass. There is no tenderness. There is no rebound and no guarding.  Musculoskeletal: Normal range of motion. He exhibits tenderness. He exhibits no edema.       Right knee: Tenderness found.       Cervical back: He exhibits tenderness.       Thoracic back: He exhibits tenderness.       Lumbar back: He exhibits tenderness.  Lymphadenopathy:  He has no cervical adenopathy.  Neurological: He is alert and oriented to person, place, and time.  Skin: Skin is warm and dry. No rash noted. He is not diaphoretic. No erythema. No pallor.  Psychiatric: He has a normal mood and affect. His behavior is normal. Judgment and thought content normal.  Nursing note and vitals reviewed.         Assessment & Plan:   1. Peptic ulcer   2. Acute otitis externa of right ear, unspecified type   3. History of depression   4. Chronic pain syndrome     External otitis of right ear Apply into ear canal and onto outer upper ear twice daily. Keep ear dry and clean. Keep hands off/out of ear. If sx's do not improve in 14  days, please call clinic.  Peptic ulcer Started on Ranitidine. Reduce acidic/spicy foods.  History of depression Started on Cymbalta 60mg . If thoughts of harming yourself/others develop, stop medication and call clinic immediately.  Chronic pain syndrome Started on Cymbalta 60mg . Short Tramadol Rx provided. Advised to stop using Marijuana for pain control. Has f/u in May with Dr. Fredric Mare discuss further treatment options then.     FOLLOW-UP:  Return if symptoms worsen or fail to improve.

## 2017-01-27 NOTE — Assessment & Plan Note (Signed)
Apply into ear canal and onto outer upper ear twice daily. Keep ear dry and clean. Keep hands off/out of ear. If sx's do not improve in 14 days, please call clinic.

## 2017-01-27 NOTE — Assessment & Plan Note (Signed)
Started on Ranitidine. Reduce acidic/spicy foods.

## 2017-02-04 ENCOUNTER — Telehealth: Payer: Self-pay

## 2017-02-04 NOTE — Telephone Encounter (Signed)
Called [name] Haney regarding lab result. [name] Haney was upset that only his vitamin D was checked. [name] Haney stated that he was here for a physical and was suppose to have all his lab work done. I informed the [name] Haney that his last labs were completed on 10/28/16 and that the insurance company will only pay for labs to be done once a year. [name] Haney stated he was going to call his insurance company because he said they told him he was suppose to have his labs done for free. [name] Haney was advised that he could come get his labs drawn if he wanted them done. However, he may have to pay for them if the insurance didn't pay for them. [name] Haney stated that he was going to call his insurance to see if they would pay for them.

## 2017-03-05 ENCOUNTER — Ambulatory Visit: Payer: Self-pay | Admitting: Otolaryngology

## 2017-03-12 ENCOUNTER — Encounter (HOSPITAL_COMMUNITY)
Admission: RE | Admit: 2017-03-12 | Discharge: 2017-03-12 | Disposition: A | Discharge status: Home / Self Care | Payer: Medicare Other | Source: Ambulatory Visit | Attending: Otolaryngology | Admitting: Otolaryngology

## 2017-03-12 ENCOUNTER — Encounter (HOSPITAL_COMMUNITY): Payer: Self-pay

## 2017-03-12 DIAGNOSIS — C44202 Unspecified malignant neoplasm of skin of right ear and external auricular canal: Secondary | ICD-10-CM | POA: Diagnosis not present

## 2017-03-12 DIAGNOSIS — Z01812 Encounter for preprocedural laboratory examination: Secondary | ICD-10-CM | POA: Diagnosis not present

## 2017-03-12 HISTORY — DX: Malignant (primary) neoplasm, unspecified: C80.1

## 2017-03-12 HISTORY — DX: Hypothyroidism, unspecified: E03.9

## 2017-03-12 HISTORY — DX: Gastro-esophageal reflux disease without esophagitis: K21.9

## 2017-03-12 LAB — BASIC METABOLIC PANEL
ANION GAP: 8 (ref 5–15)
BUN: 13 mg/dL (ref 6–20)
CO2: 27 mmol/L (ref 22–32)
Calcium: 9.2 mg/dL (ref 8.9–10.3)
Chloride: 104 mmol/L (ref 101–111)
Creatinine, Ser: 1.25 mg/dL — ABNORMAL HIGH (ref 0.61–1.24)
GFR calc Af Amer: >60 mL/min (ref >60)
GFR calc non Af Amer: 58 mL/min — ABNORMAL LOW (ref >60)
GLUCOSE: 96 mg/dL (ref 65–99)
Potassium: 4.4 mmol/L (ref 3.5–5.1)
Sodium: 139 mmol/L (ref 135–145)

## 2017-03-12 LAB — CBC
HCT: 40.9 % (ref 39.0–52.0)
Hemoglobin: 14.3 g/dL (ref 13.0–17.0)
MCH: 31.2 pg (ref 26.0–34.0)
MCHC: 35 g/dL (ref 30.0–36.0)
MCV: 89.1 fL (ref 78.0–100.0)
Platelets: 235 10*3/uL (ref 150–400)
RBC: 4.59 MIL/uL (ref 4.22–5.81)
RDW: 14.3 % (ref 11.5–15.5)
WBC: 10.9 10*3/uL — AB (ref 4.0–10.5)

## 2017-03-12 NOTE — Progress Notes (Signed)
PCP: Renold Don No cardiologist or cardiac workup/hx per pt  No chest pain, SOB or pt denies signs of infection at PAT appointment. Pt with scab to top of head, no redness, drainage.

## 2017-03-12 NOTE — Pre-Procedure Instructions (Signed)
    TATE ZAGAL  03/12/2017      [facility] - [place], [place] - [address] [place] [address] Phone: [phone] Fax: [phone]    Your procedure is scheduled on Wednesday May 2.  Report to [facility] Admitting at 8:30 A.M.  Call this number if you have problems the morning of surgery:  [phone]   Remember:  Do not eat food or drink liquids after midnight.  Take these medicines the morning of surgery with A SIP OF WATER: Gabapentin (neurontin)  7 days prior to surgery STOP taking any Aspirin, Aleve, Naproxen, Ibuprofen, Motrin, Advil, Goody's, BC's, all herbal medications, fish oil, and all vitamins    Do not wear jewelry  Do not wear lotions, powders, or colognes, or deoderant.  Men may shave face and neck.  Do not bring valuables to the hospital.  [facility] is not responsible for any belongings or valuables.  Contacts, dentures or bridgework may not be worn into surgery.  Leave your suitcase in the car.  After surgery it may be brought to your room.  For patients admitted to the hospital, discharge time will be determined by your treatment team.  Patients discharged the day of surgery will not be allowed to drive home.    Special instructions:    [place]- Preparing For Surgery  Before surgery, you can play an important role. Because skin is not sterile, your skin needs to be as free of germs as possible. You can reduce the number of germs on your skin by washing with CHG (chlorahexidine gluconate) Soap before surgery.  CHG is an antiseptic cleaner which kills germs and bonds with the skin to continue killing germs even after washing.  Please do not use if you have an allergy to CHG or antibacterial soaps. If your skin becomes reddened/irritated stop using the CHG.  Do not shave (including legs and underarms) for at least 48 hours prior to first CHG shower. It is OK to shave your face.  Please follow  these instructions carefully.   1. Shower the NIGHT BEFORE SURGERY and the MORNING OF SURGERY with CHG.   2. If you chose to wash your hair, wash your hair first as usual with your normal shampoo.  3. After you shampoo, rinse your hair and body thoroughly to remove the shampoo.  4. Use CHG as you would any other liquid soap. You can apply CHG directly to the skin and wash gently with a scrungie or a clean washcloth.   5. Apply the CHG Soap to your body ONLY FROM THE NECK DOWN.  Do not use on open wounds or open sores. Avoid contact with your eyes, ears, mouth and genitals (private parts). Wash genitals (private parts) with your normal soap.  6. Wash thoroughly, paying special attention to the area where your surgery will be performed.  7. Thoroughly rinse your body with warm water from the neck down.  8. DO NOT shower/wash with your normal soap after using and rinsing off the CHG Soap.  9. Pat yourself dry with a CLEAN TOWEL.   10. Wear CLEAN PAJAMAS   11. Place CLEAN SHEETS on your bed the night of your first shower and DO NOT SLEEP WITH PETS.    Day of Surgery: Do not apply any deodorants/lotions. Please wear clean clothes to the hospital/surgery center.

## 2017-03-18 ENCOUNTER — Ambulatory Visit (HOSPITAL_COMMUNITY): Payer: Medicare Other | Admitting: Certified Registered Nurse Anesthetist

## 2017-03-18 ENCOUNTER — Encounter (HOSPITAL_COMMUNITY)
Admission: RE | Disposition: A | Discharge status: Home / Self Care | Payer: Self-pay | Source: Ambulatory Visit | Attending: Otolaryngology

## 2017-03-18 ENCOUNTER — Ambulatory Visit (HOSPITAL_COMMUNITY)
Admission: RE | Admit: 2017-03-18 | Discharge: 2017-03-18 | Disposition: A | Discharge status: Home / Self Care | Payer: Medicare Other | Source: Ambulatory Visit | Attending: Otolaryngology | Admitting: Otolaryngology

## 2017-03-18 ENCOUNTER — Encounter (HOSPITAL_COMMUNITY): Payer: Self-pay | Admitting: *Deleted

## 2017-03-18 DIAGNOSIS — K219 Gastro-esophageal reflux disease without esophagitis: Secondary | ICD-10-CM | POA: Insufficient documentation

## 2017-03-18 DIAGNOSIS — E039 Hypothyroidism, unspecified: Secondary | ICD-10-CM | POA: Insufficient documentation

## 2017-03-18 DIAGNOSIS — Z96651 Presence of right artificial knee joint: Secondary | ICD-10-CM | POA: Diagnosis not present

## 2017-03-18 DIAGNOSIS — D0421 Carcinoma in situ of skin of right ear and external auricular canal: Secondary | ICD-10-CM | POA: Insufficient documentation

## 2017-03-18 DIAGNOSIS — Z79899 Other long term (current) drug therapy: Secondary | ICD-10-CM | POA: Insufficient documentation

## 2017-03-18 DIAGNOSIS — Z87891 Personal history of nicotine dependence: Secondary | ICD-10-CM | POA: Diagnosis not present

## 2017-03-18 DIAGNOSIS — F329 Major depressive disorder, single episode, unspecified: Secondary | ICD-10-CM | POA: Diagnosis not present

## 2017-03-18 HISTORY — PX: SKIN SPLIT GRAFT: SHX444

## 2017-03-18 HISTORY — PX: LESION REMOVAL: SHX5196

## 2017-03-18 SURGERY — WIDE EXCISION, LESION, UPPER EXTREMITY
Anesthesia: Monitor Anesthesia Care | Site: Ear | Laterality: Right

## 2017-03-18 MED ORDER — ALBUTEROL SULFATE (2.5 MG/3ML) 0.083% IN NEBU
INHALATION_SOLUTION | RESPIRATORY_TRACT | Status: AC
  Administered 2017-03-18: 2.5 mg via RESPIRATORY_TRACT
  Filled 2017-03-18: qty 3

## 2017-03-18 MED ORDER — FENTANYL CITRATE (PF) 100 MCG/2ML IJ SOLN
INTRAMUSCULAR | Status: DC | PRN
Start: 2017-03-18 — End: 2017-03-18
  Administered 2017-03-18 (×2): 25 ug via INTRAVENOUS
  Administered 2017-03-18 (×2): 50 ug via INTRAVENOUS
  Administered 2017-03-18: 25 ug via INTRAVENOUS

## 2017-03-18 MED ORDER — CEFAZOLIN SODIUM-DEXTROSE 2-4 GM/100ML-% IV SOLN
INTRAVENOUS | Status: AC
  Filled 2017-03-18: qty 100

## 2017-03-18 MED ORDER — PROPOFOL 10 MG/ML IV BOLUS
INTRAVENOUS | Status: AC
  Filled 2017-03-18: qty 20

## 2017-03-18 MED ORDER — LIDOCAINE-EPINEPHRINE 1 %-1:100000 IJ SOLN
INTRAMUSCULAR | Status: AC
  Filled 2017-03-18: qty 1

## 2017-03-18 MED ORDER — LACTATED RINGERS IV SOLN
INTRAVENOUS | Status: DC
  Administered 2017-03-18: 09:00:00 via INTRAVENOUS

## 2017-03-18 MED ORDER — MIDAZOLAM HCL 5 MG/5ML IJ SOLN
INTRAMUSCULAR | Status: DC | PRN
  Administered 2017-03-18: 2 mg via INTRAVENOUS

## 2017-03-18 MED ORDER — MIDAZOLAM HCL 2 MG/2ML IJ SOLN
INTRAMUSCULAR | Status: AC
  Filled 2017-03-18: qty 2

## 2017-03-18 MED ORDER — FENTANYL CITRATE (PF) 250 MCG/5ML IJ SOLN
INTRAMUSCULAR | Status: AC
  Filled 2017-03-18: qty 5

## 2017-03-18 MED ORDER — PROMETHAZINE HCL 25 MG RE SUPP: 25.0000 mg | Freq: Four times a day (QID) | RECTAL | 1 refills | Status: DC | PRN

## 2017-03-18 MED ORDER — CIPROFLOXACIN-DEXAMETHASONE 0.3-0.1 % OT SUSP
OTIC | Status: AC
  Filled 2017-03-18: qty 7.5

## 2017-03-18 MED ORDER — CEPHALEXIN 500 MG PO CAPS: 500.0000 mg | ORAL_CAPSULE | Freq: Three times a day (TID) | ORAL | 0 refills | Status: DC

## 2017-03-18 MED ORDER — PROMETHAZINE HCL 25 MG/ML IJ SOLN: 6.2500 mg | INTRAMUSCULAR | Status: DC | PRN

## 2017-03-18 MED ORDER — HYDROMORPHONE HCL 1 MG/ML IJ SOLN: 0.2500 mg | INTRAMUSCULAR | Status: DC | PRN

## 2017-03-18 MED ORDER — ALBUTEROL SULFATE (2.5 MG/3ML) 0.083% IN NEBU
2.5000 mg | INHALATION_SOLUTION | Freq: Once | RESPIRATORY_TRACT | Status: AC
  Administered 2017-03-18: 2.5 mg via RESPIRATORY_TRACT

## 2017-03-18 MED ORDER — BACITRACIN ZINC 500 UNIT/GM EX OINT
TOPICAL_OINTMENT | CUTANEOUS | Status: AC
  Filled 2017-03-18: qty 28.35

## 2017-03-18 MED ORDER — LIDOCAINE-EPINEPHRINE 1 %-1:100000 IJ SOLN
INTRAMUSCULAR | Status: DC | PRN
  Administered 2017-03-18: 4 mL

## 2017-03-18 MED ORDER — HYDROCODONE-ACETAMINOPHEN 7.5-325 MG PO TABS: 1.0000 | ORAL_TABLET | Freq: Four times a day (QID) | ORAL | 0 refills | Status: DC | PRN

## 2017-03-18 MED ORDER — 0.9 % SODIUM CHLORIDE (POUR BTL) OPTIME
TOPICAL | Status: DC | PRN
  Administered 2017-03-18: 1000 mL

## 2017-03-18 MED ORDER — CEFAZOLIN SODIUM-DEXTROSE 2-4 GM/100ML-% IV SOLN
2.0000 g | INTRAVENOUS | Status: AC
  Administered 2017-03-18: 2 g via INTRAVENOUS

## 2017-03-18 SURGICAL SUPPLY — 69 items
APPLIER CLIP 9.375 SM OPEN (CLIP)
BLADE DERMATONE (BLADE) IMPLANT
BLADE SURG 15 STRL LF DISP TIS (BLADE) ×1 IMPLANT
BLADE SURG 15 STRL SS (BLADE) ×2
CANISTER SUCT 3000ML PPV (MISCELLANEOUS) ×3 IMPLANT
CLEANER TIP ELECTROSURG 2X2 (MISCELLANEOUS) ×3 IMPLANT
CLIP APPLIE 9.375 SM OPEN (CLIP) IMPLANT
CONT SPEC 4OZ CLIKSEAL STRL BL (MISCELLANEOUS) ×3 IMPLANT
CORDS BIPOLAR (ELECTRODE) IMPLANT
COTTONBALL LRG STERILE PKG (GAUZE/BANDAGES/DRESSINGS) ×3 IMPLANT
COVER SURGICAL LIGHT HANDLE (MISCELLANEOUS) ×3 IMPLANT
DERMABOND ADVANCED (GAUZE/BANDAGES/DRESSINGS) ×2
DERMABOND ADVANCED .7 DNX12 (GAUZE/BANDAGES/DRESSINGS) ×1 IMPLANT
DRAIN CHANNEL 15F RND FF W/TCR (WOUND CARE) IMPLANT
DRAPE HALF SHEET 40X57 (DRAPES) IMPLANT
DRAPE INCISE 23X17 IOBAN STRL (DRAPES) ×2
DRAPE INCISE IOBAN 23X17 STRL (DRAPES) ×1 IMPLANT
DRAPE MICROSCOPE LEICA 54X105 (DRAPE) ×3 IMPLANT
DRESSING NASAL KENNEDY 3.5X.9 (MISCELLANEOUS) ×1 IMPLANT
DRESSING NASAL POPE 10X1.5X2.5 (GAUZE/BANDAGES/DRESSINGS) ×1 IMPLANT
DRSG NASAL KENNEDY 3.5X.9 (MISCELLANEOUS) ×3
DRSG NASAL POPE 10X1.5X2.5 (GAUZE/BANDAGES/DRESSINGS) ×3
ELECT COATED BLADE 2.86 ST (ELECTRODE) ×3 IMPLANT
ELECT REM PT RETURN 9FT ADLT (ELECTROSURGICAL) ×3
ELECTRODE REM PT RTRN 9FT ADLT (ELECTROSURGICAL) ×1 IMPLANT
EVACUATOR SILICONE 100CC (DRAIN) IMPLANT
GAUZE SPONGE 4X4 16PLY XRAY LF (GAUZE/BANDAGES/DRESSINGS) ×3 IMPLANT
GLOVE ECLIPSE 7.5 STRL STRAW (GLOVE) ×3 IMPLANT
GOWN BRE IMP SLV AUR LG STRL (GOWN DISPOSABLE) ×6 IMPLANT
GOWN STRL REUS W/ TWL LRG LVL3 (GOWN DISPOSABLE) ×4 IMPLANT
GOWN STRL REUS W/ TWL XL LVL3 (GOWN DISPOSABLE) ×2 IMPLANT
GOWN STRL REUS W/TWL LRG LVL3 (GOWN DISPOSABLE) ×8
GOWN STRL REUS W/TWL XL LVL3 (GOWN DISPOSABLE) ×4
KIT BASIN OR (CUSTOM PROCEDURE TRAY) ×3 IMPLANT
KIT ROOM TURNOVER OR (KITS) ×3 IMPLANT
LOCATOR NERVE 3 VOLT (DISPOSABLE) IMPLANT
NEEDLE 27GAX1X1/2 (NEEDLE) ×3 IMPLANT
NEEDLE PRECISIONGLIDE 27X1.5 (NEEDLE) IMPLANT
NS IRRIG 1000ML POUR BTL (IV SOLUTION) ×3 IMPLANT
PAD ARMBOARD 7.5X6 YLW CONV (MISCELLANEOUS) ×6 IMPLANT
PENCIL FOOT CONTROL (ELECTRODE) ×3 IMPLANT
SOL PREP POV-IOD 4OZ 10% (MISCELLANEOUS) ×3 IMPLANT
SPECIMEN JAR MEDIUM (MISCELLANEOUS) IMPLANT
SPONGE INTESTINAL PEANUT (DISPOSABLE) IMPLANT
SPONGE LAP 18X18 X RAY DECT (DISPOSABLE) IMPLANT
STAPLER VISISTAT 35W (STAPLE) IMPLANT
SUT CHROMIC 3 0 SH 27 (SUTURE) IMPLANT
SUT CHROMIC 4 0 PS 2 18 (SUTURE) IMPLANT
SUT CHROMIC 5 0 P 3 (SUTURE) IMPLANT
SUT ETHILON 3 0 PS 1 (SUTURE) IMPLANT
SUT ETHILON 5 0 PS 2 18 (SUTURE) IMPLANT
SUT PLAIN 5 0 P 3 18 (SUTURE) ×3 IMPLANT
SUT SILK 2 0 REEL (SUTURE) IMPLANT
SUT SILK 3 0 SH 30 (SUTURE) ×3 IMPLANT
SUT SILK 3 0 SH CR/8 (SUTURE) IMPLANT
SUT SILK 4 0 REEL (SUTURE) IMPLANT
SUT SILK 4 0 TF CR/8 (SUTURE) ×3 IMPLANT
SUT VIC AB 3-0 SH 18 (SUTURE) IMPLANT
SUT VIC AB 4-0 PS2 27 (SUTURE) ×3 IMPLANT
SYR BULB 3OZ (MISCELLANEOUS) ×3 IMPLANT
SYR CONTROL 10ML LL (SYRINGE) ×3 IMPLANT
TOWEL GREEN STERILE FF (TOWEL DISPOSABLE) ×3 IMPLANT
TOWEL OR 17X24 6PK STRL BLUE (TOWEL DISPOSABLE) ×3 IMPLANT
TRAY ENT MC OR (CUSTOM PROCEDURE TRAY) ×3 IMPLANT
TRAY FOLEY W/METER SILVER 14FR (SET/KITS/TRAYS/PACK) IMPLANT
TUBE CONNECTING 12'X1/4 (SUCTIONS) ×1
TUBE CONNECTING 12X1/4 (SUCTIONS) ×2 IMPLANT
TUBE FEEDING 10FR FLEXIFLO (MISCELLANEOUS) IMPLANT
WATER STERILE IRR 1000ML POUR (IV SOLUTION) IMPLANT

## 2017-03-18 NOTE — Anesthesia Postprocedure Evaluation (Addendum)
Anesthesia Post Note  Patient: [name] Haney  Procedure(s) Performed: Procedure(s) (LRB): LESION REMOVAL (Right) SKIN GRAFT SPLIT THICKNESS/RIGHT EAR (Right)  Patient location during evaluation: PACU Anesthesia Type: MAC Level of consciousness: awake and alert Pain management: pain level controlled Vital Signs Assessment: post-procedure vital signs reviewed and stable Respiratory status: spontaneous breathing and respiratory function stable Cardiovascular status: stable Anesthetic complications: no       Last Vitals:  Vitals:   03/18/17 1238 03/18/17 1244  BP:  108/74  Pulse:  63  Resp:  18  Temp: 36.4 C     Last Pain:  Vitals:   03/18/17 1244  TempSrc:   PainSc: 0-No pain                 Briggett Tuccillo DANIEL

## 2017-03-18 NOTE — Transfer of Care (Signed)
Immediate Anesthesia Transfer of Care Note  Patient: [name] Haney  Procedure(s) Performed: Procedure(s) with comments: LESION REMOVAL (Right) - excision of right ear skin cancer with skin graft SKIN GRAFT SPLIT THICKNESS/RIGHT EAR (Right)  Patient Location: PACU  Anesthesia Type:MAC  Level of Consciousness: awake, alert  and oriented  Airway & Oxygen Therapy: Patient Spontanous Breathing  Post-op Assessment: Report given to RN and Post -op Vital signs reviewed and stable  Post vital signs: Reviewed and stable  Last Vitals:  Vitals:   03/18/17 0826  BP: 134/74  Pulse: 65  Resp: 18  Temp: 37.1 C    Last Pain:  Vitals:   03/18/17 0826  TempSrc: Oral         Complications: No apparent anesthesia complications

## 2017-03-18 NOTE — Anesthesia Procedure Notes (Signed)
Procedure Name: MAC Date/Time: 03/18/2017 10:31 AM Performed by: Candis Shine Pre-anesthesia Checklist: Patient identified, Emergency Drugs available, Suction available, Patient being monitored and Timeout performed Oxygen Delivery Method: Simple face mask Dental Injury: Teeth and Oropharynx as per pre-operative assessment

## 2017-03-18 NOTE — H&P (Signed)
[name] Haney is an 66 y.o. male.   Chief Complaint: Ear skin cancer HPI: lesion of the right external ear, biopsy proven SCCA.  Past Medical History:  Diagnosis Date  . Arthritis   . Asthma    "never treated for this"  . Cancer ([place])    skin cancer to ear  . Depression   . GERD (gastroesophageal reflux disease)   . Hypothyroidism   . Substance abuse    marijuana  . Thyroid disease     Past Surgical History:  Procedure Laterality Date  . APPENDECTOMY    . LUMBAR DISC SURGERY     x 2  . MEDIAL PARTIAL KNEE REPLACEMENT Right 2009  . NECK SURGERY  2006    Family History  Problem Relation Age of Onset  . Diabetes Mother 44  . Arthritis Mother   . Heart disease Mother 7  . Stroke Father 51    x 12   Social History:  reports that he quit smoking about 29 years ago. His smoking use included Cigarettes. He has a 7.50 pack-year smoking history. He has never used smokeless tobacco. He reports that he uses drugs, including Marijuana. He reports that he does not drink alcohol.  Allergies:  Allergies  Allergen Reactions  . Avelox [Moxifloxacin Hcl In Nacl]     UNSPECIFIED REACTION     Medications Prior to Admission  Medication Sig Dispense Refill  . gabapentin (NEURONTIN) 800 MG tablet Take 1,600 mg by mouth at bedtime.    . Vitamin D, Ergocalciferol, (DRISDOL) 50000 units CAPS capsule Take 1 capsule (50,000 Units total) by mouth every 7 (seven) days. (Patient taking differently: Take 50,000 Units by mouth every Wednesday. ) 12 capsule 25  . DULoxetine (CYMBALTA) 60 MG capsule Take 1 capsule (60 mg total) by mouth daily. (Patient not taking: Reported on 03/10/2017) 30 capsule 3  . levothyroxine (SYNTHROID, LEVOTHROID) 50 MCG tablet Take 1 tablet (50 mcg total) by mouth daily. (Patient not taking: Reported on 03/10/2017) 90 tablet 3  . ranitidine (ZANTAC) 150 MG capsule Take 1 capsule (150 mg total) by mouth 2 (two) times daily. (Patient not taking: Reported on 03/10/2017) 60  capsule 2    No results found for this or any previous visit (from the past 48 hour(s)). No results found.  ROS: otherwise negative  Blood pressure 134/74, pulse 65, temperature 98.8 F (37.1 C), temperature source Oral, resp. rate 18, height 5\' 7"  (1.702 m), weight 92.4 kg (203 lb 11.2 oz), SpO2 100 %.  PHYSICAL EXAM: Overall appearance:  Healthy appearing, in no distress Head:  Normocephalic, atraumatic. Ears: Ulcerative lesion right external ear. External auditory canals are clear; tympanic membranes are intact and the middle ears are free of any effusion. Nose: External nose is healthy in appearance. Internal nasal exam free of any lesions or obstruction. Oral Cavity/pharynx:  There are no mucosal lesions or masses identified. Hypopharynx/Larynx: no signs of any mucosal lesions or masses identified. Vocal cords move normally. Neuro:  No identifiable neurologic deficits. Neck: No palpable neck masses.  Studies Reviewed: none    Assessment/Plan Excision right ear skin cancer with skin graft.  [name] Haney 03/18/2017, 10:16 AM

## 2017-03-18 NOTE — Op Note (Signed)
OPERATIVE REPORT  DATE OF SURGERY: 03/18/2017  PATIENT:  [name] Haney,  66 y.o. male  PRE-OPERATIVE DIAGNOSIS:  EXCISION SKIN CANCER, RIGHT EAR  POST-OPERATIVE DIAGNOSIS:  EXCISION SKIN CANCER, RIGHT EAR  PROCEDURE:  Procedure(s): LESION REMOVAL SKIN GRAFT SPLIT THICKNESS/RIGHT EAR  SURGEON:  Beckie Salts, MD  ASSISTANTS: Jeralene Peters RNFA  ANESTHESIA:   Local with intravenous sedation  EBL:  10 ml  DRAINS: None  LOCAL MEDICATIONS USED:  1% Xylocaine with epinephrine  SPECIMEN:  1. Right external ear lesion, sutures marked for orientation, frozen section analysis all margins negative. 2. Right ear canal lesion for routine pathology.  COUNTS:  Correct  PROCEDURE DETAILS: The patient was taken to the operating room and placed on the operating table in the supine position. Following induction of intravenous sedation , the right ear was prepped and draped in a standard fashion.  1. Excision of right external ear lesion. He was identified in the crease above the anti-tragus. A marking pen was used to outline the proposed incisions. The entire lesion measured approximately 1.5 cm. Local anesthetic was infiltrated. A 15 scalpel was used to excise the entire lesion with full-thickness of skin including underlying cartilage. Oriented with sutures and sent for frozen section analysis. Electrocautery was used for hemostasis. Frozen section analysis revealed clear margins.  2. Full-thickness skin graft harvest and repair. Postauricular skin was used. A skin graft defect of 1.5 cm was measured and identified in the postauricular skin. An elliptical incision was used to incise the skin after infiltrating local anesthetic. A 15 scalpel was used to harvest a full-thickness epidermal graft. The donor site was cauterized with electrocautery to provide hemostasis and to contract the wound. The edges were reapproximated with interrupted 4-0 Vicryl suture. Dermabond was used on the skin. The graft  was then placed into position and secured in place along the skin edges using interrupted 50 plain gut. The edges were trimmed to facilitate a perfect fit. A nasal Merocel was used as a bolster after soaking it in liquid. 3-0 silk sutures for in front and 4 behind the defect were used to secure the bolster in place.  3. Seasonal biopsy of ear canal lesion. The ear canal lesion was inspected using the operating microscope and was felt to be suspicious for possibly a very early carcinoma. This area was infiltrated with local anesthetic solution. A 15 scalpel was used to remove the entire lesion full-thickness skin. This was sent for permanent pathologic evaluation. A left cautery was used for hemostasis. Bacitracin on a cotton ball were used as a dressing. The entire lesion was approximately 1.2 cm in greatest dimension.  Patient was then transferred to PACU in stable condition.    PATIENT DISPOSITION:  To PACU, stable

## 2017-03-18 NOTE — Anesthesia Preprocedure Evaluation (Addendum)
Anesthesia Evaluation  Patient identified by MRN, date of birth, ID band Patient awake    Reviewed: Allergy & Precautions, NPO status , Patient's Chart, lab work & pertinent test results  History of Anesthesia Complications Negative for: history of anesthetic complications  Airway Mallampati: II  TM Distance: >3 FB Neck ROM: Full    Dental  (+) Dental Advisory Given, Loose   Pulmonary asthma , former smoker,    Pulmonary exam normal        Cardiovascular negative cardio ROS Normal cardiovascular exam     Neuro/Psych PSYCHIATRIC DISORDERS Depression    GI/Hepatic Neg liver ROS, GERD  ,  Endo/Other  Hypothyroidism   Renal/GU negative Renal ROS     Musculoskeletal   Abdominal   Peds  Hematology   Anesthesia Other Findings   Reproductive/Obstetrics                            Anesthesia Physical Anesthesia Plan  ASA: II  Anesthesia Plan: MAC   Post-op Pain Management:    Induction:   Airway Management Planned: Natural Airway and Nasal Cannula  Additional Equipment:   Intra-op Plan:   Post-operative Plan:   Informed Consent: I have reviewed the patients History and Physical, chart, labs and discussed the procedure including the risks, benefits and alternatives for the proposed anesthesia with the patient or authorized representative who has indicated his/her understanding and acceptance.   Dental advisory given  Plan Discussed with: Anesthesiologist  Anesthesia Plan Comments:         Anesthesia Quick Evaluation

## 2017-03-18 NOTE — Discharge Instructions (Signed)
Keep the ear dry. Okay to shower just hold a dry towel on top of the surgical site to keep water from getting inside.   The right soft dressing that is sewed into place does not require any care. Underneath that, the cotton ball should be replaced about 3 times each day with antibiotic ointment all around it.

## 2017-03-19 ENCOUNTER — Encounter (HOSPITAL_COMMUNITY): Payer: Self-pay | Admitting: Otolaryngology

## 2017-03-24 ENCOUNTER — Ambulatory Visit: Payer: Medicare Other | Admitting: Family Medicine

## 2017-04-18 NOTE — Addendum Note (Signed)
Addendum  created 04/18/17 0906 by Duane Boston, MD   Sign clinical note

## 2017-05-03 NOTE — Assessment & Plan Note (Deleted)
10 yr risk score over 10--> d/c pt need for intensive diet and lifestyle modification since pt unable to pay for meds/ intol to meds  - reck 4-85mo

## 2017-06-25 ENCOUNTER — Other Ambulatory Visit: Payer: Self-pay | Admitting: Podiatry

## 2017-06-25 ENCOUNTER — Ambulatory Visit: Payer: Medicare Other

## 2017-06-25 ENCOUNTER — Ambulatory Visit (INDEPENDENT_AMBULATORY_CARE_PROVIDER_SITE_OTHER): Payer: Medicare Other

## 2017-06-25 ENCOUNTER — Ambulatory Visit (INDEPENDENT_AMBULATORY_CARE_PROVIDER_SITE_OTHER): Payer: Medicare Other | Admitting: Podiatry

## 2017-06-25 ENCOUNTER — Encounter: Payer: Self-pay | Admitting: Podiatry

## 2017-06-25 DIAGNOSIS — M2042 Other hammer toe(s) (acquired), left foot: Secondary | ICD-10-CM

## 2017-06-25 DIAGNOSIS — IMO0002 Reserved for concepts with insufficient information to code with codable children: Secondary | ICD-10-CM

## 2017-06-25 DIAGNOSIS — M2041 Other hammer toe(s) (acquired), right foot: Secondary | ICD-10-CM

## 2017-06-25 DIAGNOSIS — Z9189 Other specified personal risk factors, not elsewhere classified: Secondary | ICD-10-CM | POA: Diagnosis not present
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
## 2017-06-25 NOTE — Progress Notes (Signed)
   Subjective:    Patient ID: [name] Haney, male    DOB: 23-Apr-1951, 66 y.o.   MRN: [id number]  HPI  Chief Complaint  Patient presents with  . Nail Problem    Rt foot toes curl down onset 2 yrs, worse last month or so       Review of Systems  Musculoskeletal: Positive for arthralgias and back pain.  Skin:       Open sores and thick scabs  Neurological: Positive for weakness.  All other systems reviewed and are negative.      Objective:   Physical Exam        Assessment & Plan:

## 2017-06-29 ENCOUNTER — Ambulatory Visit: Payer: Medicare Other | Admitting: Orthotics

## 2017-06-29 DIAGNOSIS — M2041 Other hammer toe(s) (acquired), right foot: Secondary | ICD-10-CM

## 2017-06-29 NOTE — Progress Notes (Signed)
Patient not interested in braces at this time.  He is concerned that they would fit in his shoes, and he doesn't want to buy any other shoes.  Says he will go "online" to buy a simple OTS ankle brace.

## 2020-05-17 ENCOUNTER — Other Ambulatory Visit: Payer: Self-pay | Admitting: Family Medicine

## 2020-05-17 DIAGNOSIS — R0609 Other forms of dyspnea: Secondary | ICD-10-CM

## 2020-05-18 ENCOUNTER — Other Ambulatory Visit: Payer: Self-pay

## 2020-05-18 ENCOUNTER — Ambulatory Visit (INDEPENDENT_AMBULATORY_CARE_PROVIDER_SITE_OTHER): Payer: Medicare Other | Admitting: Internal Medicine

## 2020-05-18 DIAGNOSIS — R0600 Dyspnea, unspecified: Secondary | ICD-10-CM | POA: Diagnosis not present

## 2020-05-18 DIAGNOSIS — R0609 Other forms of dyspnea: Secondary | ICD-10-CM

## 2020-05-18 LAB — PULMONARY FUNCTION TEST
DL/VA % pred: 96 %
DL/VA: 3.98 ml/min/mmHg/L
DLCO cor % pred: 103 %
DLCO cor: 24.56 ml/min/mmHg
DLCO unc % pred: 103 %
DLCO unc: 24.56 ml/min/mmHg
FEF 25-75 Post: 2.03 L/sec
FEF 25-75 Pre: 1.62 L/sec
FEF2575-%Change-Post: 25 %
FEF2575-%Pred-Post: 90 %
FEF2575-%Pred-Pre: 71 %
FEV1-%Change-Post: 5 %
FEV1-%Pred-Post: 88 %
FEV1-%Pred-Pre: 83 %
FEV1-Post: 2.59 L
FEV1-Pre: 2.44 L
FEV1FVC-%Change-Post: 0 %
FEV1FVC-%Pred-Pre: 90 %
FEV6-%Change-Post: 3 %
FEV6-%Pred-Post: 100 %
FEV6-%Pred-Pre: 96 %
FEV6-Post: 3.74 L
FEV6-Pre: 3.62 L
FEV6FVC-%Change-Post: -1 %
FEV6FVC-%Pred-Post: 103 %
FEV6FVC-%Pred-Pre: 105 %
FVC-%Change-Post: 5 %
FVC-%Pred-Post: 96 %
FVC-%Pred-Pre: 92 %
FVC-Post: 3.85 L
FVC-Pre: 3.66 L
Post FEV1/FVC ratio: 67 %
Post FEV6/FVC ratio: 97 %
Pre FEV1/FVC ratio: 67 %
Pre FEV6/FVC Ratio: 99 %
RV % pred: 127 %
RV: 2.9 L
TLC % pred: 102 %
TLC: 6.59 L

## 2020-05-18 NOTE — Progress Notes (Signed)
PFT completed today.  

## 2020-06-12 ENCOUNTER — Ambulatory Visit (INDEPENDENT_AMBULATORY_CARE_PROVIDER_SITE_OTHER): Payer: Medicare Other | Admitting: Otolaryngology

## 2020-06-12 ENCOUNTER — Encounter (INDEPENDENT_AMBULATORY_CARE_PROVIDER_SITE_OTHER): Payer: Self-pay | Admitting: Otolaryngology

## 2020-06-12 ENCOUNTER — Other Ambulatory Visit: Payer: Self-pay

## 2020-06-12 ENCOUNTER — Other Ambulatory Visit (INDEPENDENT_AMBULATORY_CARE_PROVIDER_SITE_OTHER): Payer: Self-pay

## 2020-06-12 VITALS — Temp 97.7°F

## 2020-06-12 DIAGNOSIS — H6123 Impacted cerumen, bilateral: Secondary | ICD-10-CM

## 2020-06-12 DIAGNOSIS — H903 Sensorineural hearing loss, bilateral: Secondary | ICD-10-CM

## 2020-06-12 DIAGNOSIS — H905 Unspecified sensorineural hearing loss: Secondary | ICD-10-CM | POA: Diagnosis not present

## 2020-06-12 NOTE — Progress Notes (Signed)
HPI: [name] Haney is a 69 y.o. male who presents is referred by Audrea Muscat for evaluation of asymmetric hearing loss.  Patient apparently has had hearing aids for years but lost his hearing aids a little over a year ago and presented [facility] for [facility] and new hearing aids.  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  Patient has noticed more hearing loss in the right ear over the past year.  Denies any sudden drop in hearing.  He has had no headaches and no vertigo. He had a skin cancer removed from the right pinna 3 to 4 years ago.  He thinks it might have been a melanoma..  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   Depression    GERD (gastroesophageal reflux disease)    Hypothyroidism    Substance abuse ([place])    marijuana   Thyroid disease    Past Surgical History:  Procedure Laterality Date   APPENDECTOMY     LESION REMOVAL Right 03/18/2017   Procedure: LESION REMOVAL;  Surgeon: Izora Gala, MD;  Location: [place];  Service: ENT;  Laterality: Right;  excision of right ear skin cancer with skin graft   LUMBAR DISC SURGERY     x 2   MEDIAL PARTIAL KNEE REPLACEMENT Right 2009   NECK SURGERY  2006   SKIN SPLIT GRAFT Right 03/18/2017   Procedure: SKIN GRAFT SPLIT THICKNESS/RIGHT EAR;  Surgeon: Izora Gala, MD;  Location: [place];  Service: ENT;  Laterality: Right;   Social History   Socioeconomic History   Marital status: Married    Spouse name: Not on file   Number of children: Not on file   Years of education: Not on file   Highest education level: Not on file  Occupational History   Not on file  Tobacco Use   Smoking status: Former Smoker    Packs/day: 0.50    Years: 15.00    Pack years: 7.50    Types: Cigarettes    Quit date: 11/18/1987    Years since quitting: 32.5   Smokeless tobacco: Never  Used  Substance and Sexual Activity   Alcohol use: No   Drug use: Yes    Types: Marijuana   Sexual activity: Not Currently    Partners: Female  Other Topics Concern   Not on file  Social History Narrative   Not on file   Social Determinants of Health   Financial Resource Strain:    Difficulty of Paying Living Expenses:   Food Insecurity:    Worried About Charity fundraiser in the Last Year:    Arboriculturist in the Last Year:   Transportation Needs:    Film/video editor (Medical):    Lack of Transportation (Non-Medical):   Physical Activity:    Days of Exercise per Week:    Minutes of Exercise per Session:   Stress:    Feeling of Stress :   Social Connections:    Frequency of Communication with Friends and Family:    Frequency of Social Gatherings with Friends and Family:    Attends Religious Services:    Active Member of Clubs or Organizations:    Attends Archivist Meetings:    Marital Status:    Family History  Problem Relation Age  of Onset   Diabetes Mother 73   Arthritis Mother    Heart disease Mother 107   Stroke Father 36       x 12   Allergies  Allergen Reactions   Avelox [Moxifloxacin Hcl In Nacl]     UNSPECIFIED REACTION    Prior to Admission medications   Medication Sig Start Date End Date Taking? Authorizing Provider  gabapentin (NEURONTIN) 800 MG tablet Take 1,600 mg by mouth at bedtime.   Yes [provider]     Positive ROS: Otherwise negative  All other systems have been reviewed and were otherwise negative with the exception of those mentioned in the HPI and as above.  Physical Exam: Constitutional: Alert, well-appearing, no acute distress Ears: External ears without lesions or tenderness.  He has large amount of wax buildup in the left ear canal that was removed with a curette.  Right ear canal had minimal wax buildup that was removed with a curette.  TMs were clear bilaterally.  On exam of  the ear superiorly he does have some scar tissue but no melanotic changes of the superior pinna. Nasal: External nose without lesion. Clear nasal passages Oral: Lips and gums without lesions. Tongue and palate mucosa without lesions. Posterior oropharynx clear. Neck: No palpable adenopathy or masses Respiratory: Breathing comfortably  Skin: No facial/neck lesions or rash noted.  Cerumen impaction removal  Date/Time: 06/12/2020 2:36 PM Performed by: Rozetta Nunnery, MD Authorized by: Rozetta Nunnery, MD   Consent:    Consent obtained:  Verbal   Consent given by:  Patient   Risks discussed:  Pain and bleeding Procedure details:    Location:  L ear and R ear   Procedure type: curette   Post-procedure details:    Inspection:  TM intact and canal normal   Hearing quality:  Improved   Patient tolerance of procedure:  Tolerated well, no immediate complications Comments:     TMs are clear bilaterally.    Assessment: Asymmetric sensorineural hearing loss more pronounced on the right side. Moderate wax buildup on the left side.  Plan: Ear canals were cleaned in the office.. We will plan on scheduling an MRI scan to rule out any retrocochlear pathology. Patient will call us following the MRI scan concerning results. Would recommend proceeding with hearing aids post results of MRI scan.   Radene Journey, MD   CC:

## 2020-06-17 ENCOUNTER — Ambulatory Visit
Admission: RE | Admit: 2020-06-17 | Discharge: 2020-06-17 | Disposition: A | Discharge status: Home / Self Care | Payer: Medicare Other | Source: Ambulatory Visit | Attending: Otolaryngology | Admitting: Otolaryngology

## 2020-06-17 DIAGNOSIS — H903 Sensorineural hearing loss, bilateral: Secondary | ICD-10-CM

## 2020-06-17 MED ORDER — GADOBENATE DIMEGLUMINE 529 MG/ML IV SOLN
19.0000 mL | Freq: Once | INTRAVENOUS | Status: AC | PRN
  Administered 2020-06-17: 19 mL via INTRAVENOUS

## 2020-06-19 ENCOUNTER — Encounter (INDEPENDENT_AMBULATORY_CARE_PROVIDER_SITE_OTHER): Payer: Self-pay

## 2020-06-22 ENCOUNTER — Telehealth (INDEPENDENT_AMBULATORY_CARE_PROVIDER_SITE_OTHER): Payer: Self-pay | Admitting: Otolaryngology

## 2020-06-22 NOTE — Telephone Encounter (Signed)
Called [name] Haney concerning results of the MRI scan to evaluate right ear asymmetric sensorineural hearing loss.  Reviewed with him that the MRI scan demonstrated no cochlear or retrocochlear pathology that would cause a hearing loss and that this is probably more age-related.  He did have chronic small vessel ischemic disease noted on the MRI scan.

## 2020-07-09 ENCOUNTER — Other Ambulatory Visit: Payer: Medicare Other

## 2020-09-19 ENCOUNTER — Ambulatory Visit: Payer: Medicare Other | Admitting: Gastroenterology

## 2021-12-16 ENCOUNTER — Encounter (HOSPITAL_BASED_OUTPATIENT_CLINIC_OR_DEPARTMENT_OTHER): Payer: Medicare Other | Attending: Internal Medicine | Admitting: Internal Medicine

## 2021-12-16 DIAGNOSIS — L97522 Non-pressure chronic ulcer of other part of left foot with fat layer exposed: Secondary | ICD-10-CM | POA: Insufficient documentation

## 2021-12-16 DIAGNOSIS — E039 Hypothyroidism, unspecified: Secondary | ICD-10-CM | POA: Insufficient documentation

## 2021-12-16 DIAGNOSIS — Z87891 Personal history of nicotine dependence: Secondary | ICD-10-CM | POA: Diagnosis not present

## 2021-12-16 DIAGNOSIS — T25221A Burn of second degree of right foot, initial encounter: Secondary | ICD-10-CM | POA: Insufficient documentation

## 2021-12-16 DIAGNOSIS — X58XXXA Exposure to other specified factors, initial encounter: Secondary | ICD-10-CM | POA: Insufficient documentation

## 2021-12-16 DIAGNOSIS — M069 Rheumatoid arthritis, unspecified: Secondary | ICD-10-CM | POA: Diagnosis not present

## 2021-12-16 NOTE — Progress Notes (Signed)
Haney, Haney ([id number]) Visit Report for 12/16/2021 Abuse Risk Screen Details Patient Name: Date of Service: [name], Haney R. 12/16/2021 9:15 A M Medical Record Number: [id number] Patient Account Number: [id number] Date of Birth/Sex: Treating RN: 1951/06/30 (71 y.o. Haney Haney Primary Care Haney Haney: Haney Haney Other Clinician: Referring Haney Haney: Treating Haney Haney/Extender: Haney Haney in Treatment: 0 Abuse Risk Screen Items Answer ABUSE RISK SCREEN: Has anyone close to you tried to hurt or harm you recentlyo No Do you feel uncomfortable with anyone in your familyo No Has anyone forced you do things that you didnt want to doo No Electronic Signature(s) Signed: 12/16/2021 5:33:54 PM By: Deon Pilling RN, BSN Entered By: Deon Pilling on 12/16/2021 10:10:35 -------------------------------------------------------------------------------- Activities of Daily Living Details Patient Name: Date of Service: [name], [name] 12/16/2021 9:15 A M Medical Record Number: [id number] Patient Account Number: [id number] Date of Birth/Sex: Treating RN: [birth year] (71 y.o. Haney Haney Primary Care Briggette Najarian: Haney Haney Other Clinician: Referring Haney Haney: Treating Haney Haney/Extender: Haney Haney Weeks in Treatment: 0 Activities of Daily Living Items Answer Activities of Daily Living (Please select one for each item) Drive Automobile Completely Able T Medications ake Completely Able Use T elephone Completely Able Care for Appearance Completely Able Use T oilet Completely Able Bath / Shower Completely Able Dress Self Completely Able Feed Self Completely Able Walk Completely Able Get In / Out Bed Completely Able Housework Completely Able Prepare Meals Completely [place] Completely Able Shop for Self Completely Able Electronic Signature(s) Signed: 12/16/2021 5:33:54 PM By: Deon Pilling  RN, BSN Entered By: Deon Pilling on 12/16/2021 10:10:54 -------------------------------------------------------------------------------- Education Screening Details Patient Name: Date of Service: [name] Haney, Haney R. 12/16/2021 9:15 A M Medical Record Number: [id number] Patient Account Number: [id number] Date of Birth/Sex: Treating RN: [birth year] (71 y.o. [name], [name] Primary Care Portia Wisdom: Haney Haney Other Clinician: Referring Eunie Lawn: Treating Jaelene Garciagarcia/Extender: Haney Haney in Treatment: 0 Primary Learner Assessed: Patient Learning Preferences/Education Level/Primary Language Learning Preference: Explanation, Demonstration, Printed Material Highest Education Level: College or Above Preferred Language: English Cognitive Barrier Language Barrier: No Translator Needed: No Memory Deficit: No Emotional Barrier: No Cultural/Religious Beliefs Affecting Medical Care: No Physical Barrier Impaired Vision: Yes Glasses Impaired Hearing: Yes Hearing Aid Decreased Hand dexterity: No Knowledge/Comprehension Knowledge Level: High Comprehension Level: High Ability to understand written instructions: High Ability to understand verbal instructions: High Motivation Anxiety Level: Calm Cooperation: Cooperative Education Importance: Acknowledges Need Interest in Health Problems: Asks Questions Perception: Coherent Willingness to Engage in Self-Management High Activities: Readiness to Engage in Self-Management High Activities: Electronic Signature(s) Signed: 12/16/2021 5:33:54 PM By: Deon Pilling RN, BSN Entered By: Deon Pilling on 12/16/2021 10:12:01 -------------------------------------------------------------------------------- Fall Risk Assessment Details Patient Name: Date of Service: [name] Haney, [name] 12/16/2021 9:15 A M Medical Record Number: [id number] Patient Account Number: [id number] Date of Birth/Sex: Treating RN: Jul 14, 1951  (71 y.o. [name], [name] Primary Care Jacquelyn Antony: Haney Haney Other Clinician: Referring Zayvian Mcmurtry: Treating Cinderella Christoffersen/Extender: Haney Haney in Treatment: 0 Fall Risk Assessment Items Have you had 2 or more falls in the last 12 monthso 0 No Have you had any fall that resulted in injury in the last 12 monthso 0 No FALLS RISK SCREEN History of falling - immediate or within 3 months 0 No Secondary diagnosis (Do you have 2 or more medical diagnoseso) 0 No Ambulatory aid None/bed rest/wheelchair/nurse 0 Yes Crutches/cane/walker 0 No Furniture 0 No Intravenous  therapy Access/Saline/Heparin Lock 0 No Gait/Transferring Normal/ bed rest/ wheelchair 0 Yes Weak (short steps with or without shuffle, stooped but able to lift head while walking, may seek 0 No support from furniture) Impaired (short steps with shuffle, may have difficulty arising from chair, head down, impaired 0 No balance) Mental Status Oriented to own ability 0 Yes Electronic Signature(s) Signed: 12/16/2021 5:33:54 PM By: Deon Pilling RN, BSN Entered By: Deon Pilling on 12/16/2021 10:12:10 -------------------------------------------------------------------------------- Foot Assessment Details Patient Name: Date of Service: [name] Haney, Haney R. 12/16/2021 9:15 A M Medical Record Number: [id number] Patient Account Number: [id number] Date of Birth/Sex: Treating RN: 1951/09/19 (71 y.o. Haney Haney Primary Care Collen Hostler: Haney Haney Other Clinician: Referring Virgle Arth: Treating Graycie Halley/Extender: Haney Haney Weeks in Treatment: 0 Foot Assessment Items Site Locations + = Sensation present, - = Sensation absent, C = Callus, U = Ulcer R = Redness, W = Warmth, M = Maceration, PU = Pre-ulcerative lesion F = Fissure, S = Swelling, D = Dryness Assessment Right: Left: Other Deformity: No No Prior Foot Ulcer: No No Prior Amputation: No No Charcot  Joint: No No Ambulatory Status: Ambulatory Without Help Gait: Steady Electronic Signature(s) Signed: 12/16/2021 5:33:54 PM By: Deon Pilling RN, BSN Entered By: Deon Pilling on 12/16/2021 10:26:04 -------------------------------------------------------------------------------- Nutrition Risk Screening Details Patient Name: Date of Service: [name] Haney, Gabreil R. 12/16/2021 9:15 A M Medical Record Number: [id number] Patient Account Number: [id number] Date of Birth/Sex: Treating RN: 10-Jan-1951 (71 y.o. [name], [name] Primary Care Yiannis Tulloch: Haney Haney Other Clinician: Referring Carrianne Hyun: Treating Edona Schreffler/Extender: Haney Haney Weeks in Treatment: 0 Height (in): 67 Weight (lbs): 208 Body Mass Index (BMI): 32.6 Nutrition Risk Screening Items Score Screening NUTRITION RISK SCREEN: I have an illness or condition that made me change the kind and/or amount of food I eat 2 Yes I eat fewer than two meals per day 0 No I eat few fruits and vegetables, or milk products 0 No I have three or more drinks of beer, liquor or wine almost every day 0 No I have tooth or mouth problems that make it hard for me to eat 0 No I don't always have enough money to buy the food I need 0 No I eat alone most of the time 0 No I take three or more different prescribed or over-the-counter drugs a day 0 No Without wanting to, I have lost or gained 10 pounds in the last six months 0 No I am not always physically able to shop, cook and/or feed myself 0 No Nutrition Protocols Good Risk Protocol 0 No interventions needed Moderate Risk Protocol High Risk Proctocol Risk Level: Good Risk Score: 2 Electronic Signature(s) Signed: 12/16/2021 5:33:54 PM By: Deon Pilling RN, BSN Entered By: Deon Pilling on 12/16/2021 10:13:31

## 2021-12-16 NOTE — Progress Notes (Signed)
AULDEN, CALISE ([id number]) Visit Report for 12/16/2021 Allergy List Details Patient Name: Date of Service: [facility] Keller, Darry R. 12/16/2021 9:15 A M Medical Record Number: [id number] Patient Account Number: [id number] Date of Birth/Sex: Treating RN: [birth year] (71 y.o. [name] Primary Care Hikeem Andersson: Leonard Downing Other Clinician: Referring Pernell Lenoir: Treating Candis Kabel/Extender: Dellia Nims Weeks in Treatment: 0 Allergies Active Allergies No Known Drug Allergies Allergy Notes Electronic Signature(s) Signed: 12/16/2021 5:33:54 PM By: Deon Pilling RN, BSN Entered By: Deon Pilling on 12/16/2021 10:10:25 -------------------------------------------------------------------------------- Arrival Information Details Patient Name: Date of Service: [name], Leavy R. 12/16/2021 9:15 A M Medical Record Number: [id number] Patient Account Number: [id number] Date of Birth/Sex: Treating RN: [birth year] (71 y.o. [name] Primary Care Merlean Pizzini: Leonard Downing Other Clinician: Referring Samin Milke: Treating Sequita Wise/Extender: [name]: 0 Visit Information Patient Arrived: Ambulatory Arrival Time: 10:09 Accompanied By: self Transfer Assistance: None Patient Identification Verified: Yes Secondary Verification Process Completed: Yes Patient Requires Transmission-Based Precautions: No Patient Has Alerts: No Electronic Signature(s) Signed: 12/16/2021 5:33:54 PM By: Deon Pilling RN, BSN Entered By: Deon Pilling on 12/16/2021 10:09:54 -------------------------------------------------------------------------------- Clinic Level of Care Assessment Details Patient Name: Date of Service: [name], Robert R. 12/16/2021 9:15 A M Medical Record Number: [id number] Patient Account Number: [id number] Date of Birth/Sex: Treating RN: 01/16/1951 (71 y.o. [name] Primary Care Gara Kincade: Leonard Downing  Other Clinician: Referring Timonthy Hovater: Treating Laylee Schooley/Extender: [name]: 0 Clinic Level of Care Assessment Items TOOL 1 Quantity Score X- 1 0 Use when EandM and Procedure is performed on INITIAL visit ASSESSMENTS - Nursing Assessment / Reassessment X- 1 20 General Physical Exam (combine w/ comprehensive assessment (listed just below) when performed on new pt. evals) X- 1 25 Comprehensive Assessment (HX, ROS, Risk Assessments, Wounds Hx, etc.) ASSESSMENTS - Wound and Skin Assessment / Reassessment X- 1 10 Dermatologic / Skin Assessment (not related to wound area) ASSESSMENTS - Ostomy and/or Continence Assessment and Care []  - 0 Incontinence Assessment and Management []  - 0 Ostomy Care Assessment and Management (repouching, etc.) PROCESS - Coordination of Care X - Simple Patient / Family Education for ongoing care 1 15 []  - 0 Complex (extensive) Patient / Family Education for ongoing care X- 1 10 Staff obtains Programmer, systems, Records, T Results / Process Orders est []  - 0 Staff telephones HHA, Nursing Homes / Clarify orders / etc []  - 0 Routine Transfer to another Facility (non-emergent condition) []  - 0 Routine Hospital Admission (non-emergent condition) X- 1 15 New Admissions / Biomedical engineer / Ordering NPWT Apligraf, etc. , []  - 0 Emergency Hospital Admission (emergent condition) PROCESS - Special Needs []  - 0 Pediatric / Minor Patient Management []  - 0 Isolation Patient Management []  - 0 Hearing / Language / Visual special needs []  - 0 Assessment of Community assistance (transportation, D/C planning, etc.) []  - 0 Additional assistance / Altered mentation []  - 0 Support Surface(s) Assessment (bed, cushion, seat, etc.) INTERVENTIONS - Miscellaneous []  - 0 External ear exam []  - 0 Patient Transfer (multiple staff / Civil Service fast streamer / Similar devices) []  - 0 Simple Staple / Suture removal (25 or less) []  -  0 Complex Staple / Suture removal (26 or more) []  - 0 Hypo/Hyperglycemic Management (do not check if billed separately) X- 1 15 Ankle / Brachial Index (ABI) - do not check if billed separately Has the patient been seen at the hospital within the last three years:  Yes Total Score: 110 Level Of Care: New/Established - Level 3 Electronic Signature(s) Signed: 12/16/2021 5:33:54 PM By: Deon Pilling RN, BSN Entered By: Deon Pilling on 12/16/2021 11:04:33 -------------------------------------------------------------------------------- Encounter Discharge Information Details Patient Name: Date of Service: [facility], Caydan R. 12/16/2021 9:15 A M Medical Record Number: [id number] Patient Account Number: [id number] Date of Birth/Sex: Treating RN: 23-Oct-1951 (71 y.o. [name] Primary Care Shaquon Gropp: Leonard Downing Other Clinician: Referring Kahleah Crass: Treating Sanjana Folz/Extender: [name]: 0 Encounter Discharge Information Items Discharge Condition: Stable Ambulatory Status: Ambulatory Discharge Destination: Home Transportation: Private Auto Accompanied By: self Schedule Follow-up Appointment: Yes Clinical Summary of Care: Electronic Signature(s) Signed: 12/16/2021 5:33:54 PM By: Deon Pilling RN, BSN Entered By: Deon Pilling on 12/16/2021 11:05:06 -------------------------------------------------------------------------------- Lower Extremity Assessment Details Patient Name: Date of Service: [name], Ferlin R. 12/16/2021 9:15 A M Medical Record Number: [id number] Patient Account Number: [id number] Date of Birth/Sex: Treating RN: Apr 13, 1951 (71 y.o. [name], [name] Primary Care Anderson Middlebrooks: Leonard Downing Other Clinician: Referring Bao Bazen: Treating Arhaan Chesnut/Extender: Dellia Nims Weeks in Treatment: 0 Edema Assessment Assessed: Shirlyn Goltz: Yes] Patrice Paradise: No] Edema: [Left: N] [Right:  o] Calf Left: Right: Point of Measurement: 28 cm From Medial Instep 37.5 cm Ankle Left: Right: Point of Measurement: 9 cm From Medial Instep 23 cm Knee To Floor Left: Right: From Medial Instep 42 cm Vascular Assessment Pulses: Dorsalis Pedis Palpable: [Left:Yes] Doppler Audible: [Left:Yes] Posterior Tibial Palpable: [Left:Yes] Doppler Audible: [Left:Yes] Blood Pressure: Brachial: [Left:121] Ankle: [Left:Dorsalis Pedis: 118 0.98] Electronic Signature(s) Signed: 12/16/2021 5:33:54 PM By: Deon Pilling RN, BSN Entered By: Deon Pilling on 12/16/2021 10:25:40 -------------------------------------------------------------------------------- Multi Wound Chart Details Patient Name: Date of Service: [name], Dammon R. 12/16/2021 9:15 A M Medical Record Number: [id number] Patient Account Number: [id number] Date of Birth/Sex: Treating RN: 12-19-1950 (71 y.o. [name] Primary Care Del Wiseman: Leonard Downing Other Clinician: Referring Kouper Spinella: Treating Genna Casimir/Extender: Dellia Nims Weeks in Treatment: 0 Vital Signs Height(in): 67 Pulse(bpm): 58 Weight(lbs): 208 Blood Pressure(mmHg): 121/80 Body Mass Index(BMI): 32.6 Temperature(F): 98.2 Respiratory Rate(breaths/min): 20 Photos: [N/A:N/A] Left, Medial Calcaneus N/A N/A Wound Location: Thermal Burn N/A N/A Wounding Event: 2nd degree Burn N/A N/A Primary Etiology: Cataracts, Asthma, Rheumatoid N/A N/A Comorbid History: Arthritis, Osteoarthritis 11/15/2021 N/A N/A Date Acquired: 0 N/A N/A Weeks of Treatment: Open N/A N/A Wound Status: No N/A N/A Wound Recurrence: 1.6x1x0.1 N/A N/A Measurements L x W x D (cm) 1.257 N/A N/A A (cm) : rea 0.126 N/A N/A Volume (cm) : 0.00% N/A N/A % Reduction in Area: 0.00% N/A N/A % Reduction in Volume: Full Thickness With Exposed Support N/A N/A Classification: Structures Medium N/A N/A Exudate A mount: Serosanguineous N/A  N/A Exudate Type: red, brown N/A N/A Exudate Color: Distinct, outline attached N/A N/A Wound Margin: Small (1-33%) N/A N/A Granulation Amount: Pink N/A N/A Granulation Quality: Large (67-100%) N/A N/A Necrotic Amount: Eschar, Adherent Slough N/A N/A Necrotic Tissue: Fat Layer (Subcutaneous Tissue): Yes N/A N/A Exposed Structures: Fascia: No Tendon: No Muscle: No Joint: No Bone: No Small (1-33%) N/A N/A Epithelialization: Dressings and/or debridement of N/A N/A Procedures Performed: burns; small Treatment Notes Wound #1 (Calcaneus) Wound Laterality: Left, Medial Cleanser Soap and Water Discharge Instruction: May shower and wash wound with dial antibacterial soap and water prior to dressing change. Peri-Wound Care Skin Prep Discharge Instruction: Use skin prep as directed Topical Primary Dressing Hydrofera Blue Classic Foam, 2x2 in Discharge Instruction: Moisten with saline prior to applying  over the santyl. Santyl Ointment Discharge Instruction: Apply nickel thick amount to wound bed then apply hydrofera blue Secondary Dressing Zetuvit Plus Silicone Border Dressing 4x4 (in/in) Discharge Instruction: Apply silicone border over primary dressing as directed. Secured With Compression Wrap Compression Stockings Environmental education officer) Signed: 12/16/2021 12:27:20 PM By: Kalman Shan DO Signed: 12/16/2021 4:11:37 PM By: Fara Chute By: Kalman Shan on 12/16/2021 12:12:10 -------------------------------------------------------------------------------- Multi-Disciplinary Care Plan Details Patient Name: Date of Service: [facility], Angelina R. 12/16/2021 9:15 A M Medical Record Number: [id number] Patient Account Number: [id number] Date of Birth/Sex: Treating RN: Sep 21, 1951 (71 y.o. [name], [name] Primary Care Kron Everton: Leonard Downing Other Clinician: Referring Nicolae Vasek: Treating Mourad Cwikla/Extender: [name]: 0 Active Inactive Orientation to the Wound Care Program Nursing Diagnoses: Knowledge deficit related to the wound healing center program Goals: Patient/caregiver will verbalize understanding of the [place] Date Initiated: 12/16/2021 Target Resolution Date: 12/20/2021 Goal Status: Active Interventions: Provide education on orientation to the wound center Notes: Pain, Acute or Chronic Nursing Diagnoses: Pain, acute or chronic: actual or potential Potential alteration in comfort, pain Goals: Patient will verbalize adequate pain control and receive pain control interventions during procedures as needed Date Initiated: 12/16/2021 Target Resolution Date: 12/18/2021 Goal Status: Active Patient/caregiver will verbalize comfort level met Date Initiated: 12/16/2021 Target Resolution Date: 12/19/2021 Goal Status: Active Interventions: Encourage patient to take pain medications as prescribed Provide education on pain management Treatment Activities: Administer pain control measures as ordered : 12/16/2021 Notes: Wound/Skin Impairment Nursing Diagnoses: Knowledge deficit related to ulceration/compromised skin integrity Goals: Patient/caregiver will verbalize understanding of skin care regimen Date Initiated: 12/16/2021 Target Resolution Date: 12/20/2021 Goal Status: Active Interventions: Assess patient/caregiver ability to obtain necessary supplies Assess patient/caregiver ability to perform ulcer/skin care regimen upon admission and as needed Provide education on ulcer and skin care Treatment Activities: Skin care regimen initiated : 12/16/2021 Topical wound management initiated : 12/16/2021 Notes: Electronic Signature(s) Signed: 12/16/2021 5:33:54 PM By: Deon Pilling RN, BSN Entered By: Deon Pilling on 12/16/2021 10:53:53 -------------------------------------------------------------------------------- Pain Assessment Details Patient  Name: Date of Service: [name], Lennell R. 12/16/2021 9:15 A M Medical Record Number: [id number] Patient Account Number: [id number] Date of Birth/Sex: Treating RN: [birth year] (71 y.o. [name] Primary Care Croix Presley: Leonard Downing Other Clinician: Referring Cherice Glennie: Treating Arline Ketter/Extender: Dellia Nims Weeks in Treatment: 0 Active Problems Location of Pain Severity and Description of Pain Patient Has Paino Yes Site Locations Pain Location: Pain Location: Generalized Pain, Pain in Ulcers Rate the pain. Current Pain Level: 2 Worst Pain Level: 10 Least Pain Level: 0 Tolerable Pain Level: 8 Pain Management and Medication Current Pain Management: Medication: No Cold Application: No Rest: No Massage: No Activity: No T.E.N.S.: No Heat Application: No Leg drop or elevation: No Is the Current Pain Management Adequate: Adequate How does your wound impact your activities of daily livingo Sleep: No Bathing: No Appetite: No Relationship With Others: No Bladder Continence: No Emotions: No Bowel Continence: No Work: No Toileting: No Drive: No Dressing: No Hobbies: No Engineer, maintenance) Signed: 12/16/2021 5:33:54 PM By: Deon Pilling RN, BSN Entered By: Deon Pilling on 12/16/2021 10:13:50 -------------------------------------------------------------------------------- Patient/Caregiver Education Details Patient Name: Date of Service: [name], Cian R. 1/30/2023andnbsp9:15 A M Medical Record Number: [id number] Patient Account Number: [id number] Date of Birth/Gender: Treating RN: [birth year] (71 y.o. [name] Primary Care Physician: Leonard Downing Other Clinician: Referring Physician: Treating Physician/Extender: Dellia Nims Weeks in  Treatment: 0 Education Assessment Education Provided To: Patient Education Topics Provided Welcome T [facility]: o Handouts: Welcome T  [facility] o Methods: Explain/Verbal, Printed Responses: Reinforcements needed Wound/Skin Impairment: Handouts: Caring for Your Ulcer, Skin Care Do's and Dont's Methods: Explain/Verbal, Printed Responses: Reinforcements needed Electronic Signature(s) Signed: 12/16/2021 5:33:54 PM By: Deon Pilling RN, BSN Entered By: Deon Pilling on 12/16/2021 10:54:16 -------------------------------------------------------------------------------- Wound Assessment Details Patient Name: Date of Service: [name], Conrado R. 12/16/2021 9:15 A M Medical Record Number: [id number] Patient Account Number: [id number] Date of Birth/Sex: Treating RN: 08/19/1951 (71 y.o. [name] Primary Care Athanasios Heldman: Leonard Downing Other Clinician: Referring Morene Cecilio: Treating Antawan Mchugh/Extender: Dellia Nims Weeks in Treatment: 0 Wound Status Wound Number: 1 Primary Etiology: 2nd degree Burn Wound Location: Left, Medial Calcaneus Wound Status: Open Wounding Event: Thermal Burn Comorbid History: Cataracts, Asthma, Rheumatoid Arthritis, Osteoarthritis Date Acquired: 11/15/2021 Weeks Of Treatment: 0 Clustered Wound: No Photos Wound Measurements Length: (cm) 1.6 Width: (cm) 1 Depth: (cm) 0.1 Area: (cm) 1.257 Volume: (cm) 0.126 % Reduction in Area: 0% % Reduction in Volume: 0% Epithelialization: Small (1-33%) Tunneling: No Undermining: No Wound Description Classification: Full Thickness With Exposed Support Structures Wound Margin: Distinct, outline attached Exudate Amount: Medium Exudate Type: Serosanguineous Exudate Color: red, brown Foul Odor After Cleansing: No Slough/Fibrino Yes Wound Bed Granulation Amount: Small (1-33%) Exposed Structure Granulation Quality: Pink Fascia Exposed: No Necrotic Amount: Large (67-100%) Fat Layer (Subcutaneous Tissue) Exposed: Yes Necrotic Quality: Eschar, Adherent Slough Tendon Exposed: No Muscle Exposed: No Joint  Exposed: No Bone Exposed: No Treatment Notes Wound #1 (Calcaneus) Wound Laterality: Left, Medial Cleanser Soap and Water Discharge Instruction: May shower and wash wound with dial antibacterial soap and water prior to dressing change. Peri-Wound Care Skin Prep Discharge Instruction: Use skin prep as directed Topical Primary Dressing Hydrofera Blue Classic Foam, 2x2 in Discharge Instruction: Moisten with saline prior to applying over the santyl. Santyl Ointment Discharge Instruction: Apply nickel thick amount to wound bed then apply hydrofera blue Secondary Dressing Zetuvit Plus Silicone Border Dressing 4x4 (in/in) Discharge Instruction: Apply silicone border over primary dressing as directed. Secured With Compression Wrap Compression Stockings Environmental education officer) Signed: 12/16/2021 5:33:54 PM By: Deon Pilling RN, BSN Entered By: Deon Pilling on 12/16/2021 10:29:36 -------------------------------------------------------------------------------- Vitals Details Patient Name: Date of Service: [name], Aleric R. 12/16/2021 9:15 A M Medical Record Number: [id number] Patient Account Number: [id number] Date of Birth/Sex: Treating RN: 12/12/1950 (71 y.o. [name], [name] Primary Care Yvett Rossel: Leonard Downing Other Clinician: Referring Frisco Cordts: Treating Zelie Asbill/Extender: Dellia Nims Weeks in Treatment: 0 Vital Signs Time Taken: 10:00 Temperature (F): 98.2 Height (in): 67 Pulse (bpm): 67 Source: Stated Respiratory Rate (breaths/min): 20 Weight (lbs): 208 Blood Pressure (mmHg): 121/80 Source: Stated Reference Range: 80 - 120 mg / dl Body Mass Index (BMI): 32.6 Electronic Signature(s) Signed: 12/16/2021 5:33:54 PM By: Deon Pilling RN, BSN Entered By: Deon Pilling on 12/16/2021 10:10:16

## 2021-12-16 NOTE — Progress Notes (Signed)
BORA, BRONER ([id number]) Visit Report for 12/16/2021 Chief Complaint Document Details Patient Name: Date of Service: [facility], [name] 12/16/2021 9:15 A M Medical Record Number: [id number] Patient Account Number: [id number] Date of Birth/Sex: Treating RN: [birth year] (71 y.o. [name] Primary Care Provider: Leonard Downing Other Clinician: Referring Provider: Treating Provider/Extender: Dellia Nims Weeks in Treatment: 0 Information Obtained from: Patient Chief Complaint Left foot wound Electronic Signature(s) Signed: 12/16/2021 12:27:20 PM By: Kalman Shan DO Entered By: Kalman Shan on 12/16/2021 12:12:25 -------------------------------------------------------------------------------- HPI Details Patient Name: Date of Service: [name], [name] 12/16/2021 9:15 A M Medical Record Number: [id number] Patient Account Number: [id number] Date of Birth/Sex: Treating RN: Feb 14, 1951 (71 y.o. [name] Primary Care Provider: Leonard Downing Other Clinician: Referring Provider: Treating Provider/Extender: [name]: 0 History of Present Illness HPI Description: Admission 12/16/2021 [name] is a 71 year old male with a past medical history of hypothyroidism and chronic pain syndrome that presents to the clinic for a burn to his left foot one month ago. He states he has electric socks that keep his feet warm and these caused the burn. He denies a history of neuropathy. He started using an ointment recently to the burn site but is not sure of the name. Prior to that he has just been keeping the area covered. The wound has been stable over the past month with little improvement in healing. He currently denies signs of infection. Electronic Signature(s) Signed: 12/16/2021 12:27:20 PM By: Kalman Shan DO Entered By: Kalman Shan on 12/16/2021  12:20:39 -------------------------------------------------------------------------------- Dressings and/or debridement of burns; small Details Patient Name: Date of Service: [facility], [name] 12/16/2021 9:15 A M Medical Record Number: [id number] Patient Account Number: [id number] Date of Birth/Sex: Treating RN: 1951/06/01 (71 y.o. [name] Primary Care Provider: Leonard Downing Other Clinician: Referring Provider: Treating Provider/Extender: [name]: 0 Procedure Performed for: Wound #1 Left,Medial Calcaneus Performed By: Physician Kalman Shan, DO Post Procedure Diagnosis Same as Pre-procedure Notes currette used to debride slough, eschar, subQ tissue, and dermis. Electronic Signature(s) Signed: 12/16/2021 12:27:20 PM By: Kalman Shan DO Signed: 12/16/2021 5:33:54 PM By: Deon Pilling RN, BSN Entered By: Deon Pilling on 12/16/2021 10:57:45 -------------------------------------------------------------------------------- Physical Exam Details Patient Name: Date of Service: [name], [name] 12/16/2021 9:15 A M Medical Record Number: [id number] Patient Account Number: [id number] Date of Birth/Sex: Treating RN: 12-08-1950 (71 y.o. [name] Primary Care Provider: Leonard Downing Other Clinician: Referring Provider: Treating Provider/Extender: Dellia Nims Weeks in Treatment: 0 Constitutional respirations regular, non-labored and within target range for patient.. Cardiovascular 2+ dorsalis pedis/posterior tibialis pulses. Psychiatric pleasant and cooperative. Notes Left foot: T the medial calcaneus there is an open wound with nonviable tissue and scant granulation tissue present. No signs of surrounding infection. No o drainage noted. Electronic Signature(s) Signed: 12/16/2021 12:27:20 PM By: Kalman Shan DO Entered By: Kalman Shan on 12/16/2021  12:21:15 -------------------------------------------------------------------------------- Physician Orders Details Patient Name: Date of Service: [name], [name] 12/16/2021 9:15 A M Medical Record Number: [id number] Patient Account Number: [id number] Date of Birth/Sex: Treating RN: 1951/05/11 (71 y.o. [name] Primary Care Provider: Leonard Downing Other Clinician: Referring Provider: Treating Provider/Extender: [name]: 0 Verbal / Phone Orders: No Diagnosis Coding Follow-up Appointments ppointment in 1 week. - Dr. Heber [place] Return A Other: [employer]- wound care bordered  foam dressings. Bathing/ Shower/ Hygiene May shower and wash wound with soap and water. - with dressing changes. Edema Control - Lymphedema / SCD / Other Elevate legs to the level of the heart or above for 30 minutes daily and/or when sitting, a frequency of: - 3=4 times a day throughout the day. Avoid standing for long periods of time. Moisturize legs daily. - both legs every night before bed. Off-Loading Other: - ensure no pressure or rubbing from shoes. Wound Treatment Wound #1 - Calcaneus Wound Laterality: Left, Medial Cleanser: Soap and Water 1 x Per Day/30 Days Discharge Instructions: May shower and wash wound with dial antibacterial soap and water prior to dressing change. Peri-Wound Care: Skin Prep (DME) (Generic) 1 x Per Day/30 Days Discharge Instructions: Use skin prep as directed Prim Dressing: Hydrofera Blue Classic Foam, 2x2 in 1 x Per Day/30 Days ary Discharge Instructions: Moisten with saline prior to applying over the santyl. Prim Dressing: Santyl Ointment 1 x Per Day/30 Days ary Discharge Instructions: Apply nickel thick amount to wound bed then apply hydrofera blue Secondary Dressing: Zetuvit Plus Silicone Border Dressing 4x4 (in/in) (DME) (Generic) 1 x Per Day/30 Days Discharge Instructions: Apply silicone border over  primary dressing as directed. Patient Medications llergies: No Known Drug Allergies A Notifications Medication Indication Start End lidocaine DOSE topical 5 % ointment - ointment topical applied only in clinic. Electronic Signature(s) Signed: 12/16/2021 12:27:20 PM By: Kalman Shan DO Signed: 12/16/2021 5:33:54 PM By: Deon Pilling RN, BSN Entered By: Deon Pilling on 12/16/2021 11:03:26 Prescription 12/16/2021 -------------------------------------------------------------------------------- Allard, Romone R. Kalman Shan DO Patient Name: Provider: 28-Oct-1951 [id number] Date of Birth: NPI#: Jerilynn Mages [id number] Sex: DEA #: [phone] Phone #: License #: [employer] Patient Address: [phone] [place] [address] [place], [place] [address] [place], [place] [address] [phone] Allergies No Known Drug Allergies Medication Medication: Route: Strength: Form: lidocaine topical 5% ointment Class: TOPICAL LOCAL ANESTHETICS Dose: Frequency / Time: Indication: ointment topical applied only in clinic. Number of Refills: Number of Units: 0 Generic Substitution: Start Date: End Date: Administered at Facility: Substitution Permitted Yes Time Administered: Time Discontinued: Note to Pharmacy: Hand Signature: Date(s): Electronic Signature(s) Signed: 12/16/2021 12:27:20 PM By: Kalman Shan DO Signed: 12/16/2021 5:33:54 PM By: Deon Pilling RN, BSN Entered By: Deon Pilling on 12/16/2021 11:03:26 -------------------------------------------------------------------------------- Problem List Details Patient Name: Date of Service: [name], Miles R. 12/16/2021 9:15 A M Medical Record Number: [id number] Patient Account Number: [id number] Date of Birth/Sex: Treating RN: 1950/11/20 (71 y.o. [name] Primary Care Provider: Leonard Downing Other Clinician: Referring Provider: Treating Provider/Extender:  [name]: 0 Active Problems ICD-10 Encounter Code Description Active Date MDM Diagnosis [phone] Non-pressure chronic ulcer of other part of left foot with fat layer exposed 12/16/2021 No Yes T25.221A Burn of second degree of right foot, initial encounter 12/16/2021 No Yes E03.9 Hypothyroidism, unspecified 12/16/2021 No Yes Inactive Problems Resolved Problems Electronic Signature(s) Signed: 12/16/2021 12:27:20 PM By: Kalman Shan DO Entered By: Kalman Shan on 12/16/2021 12:12:05 -------------------------------------------------------------------------------- Progress Note Details Patient Name: Date of Service: [name], Danna R. 12/16/2021 9:15 A M Medical Record Number: [id number] Patient Account Number: [id number] Date of Birth/Sex: Treating RN: 1951/01/26 (71 y.o. [name] Primary Care Provider: Leonard Downing Other Clinician: Referring Provider: Treating Provider/Extender: [name]: 0 Subjective Chief Complaint Information obtained from Patient Left foot wound History of Present Illness (HPI) Admission  12/16/2021 Mr. Jak Haggar is a 71 year old male with a past medical history of hypothyroidism and chronic pain syndrome that presents to the clinic for a burn to his left foot one month ago. He states he has electric socks that keep his feet warm and these caused the burn. He denies a history of neuropathy. He started using an ointment recently to the burn site but is not sure of the name. Prior to that he has just been keeping the area covered. The wound has been stable over the past month with little improvement in healing. He currently denies signs of infection. Patient History Information obtained from Patient. Allergies No Known Drug Allergies Family History Heart Disease - Father,Mother, Hypertension - Mother,Father, Stroke - Father, Thyroid Problems  - Mother, No family history of Cancer, Diabetes, Hereditary Spherocytosis, Kidney Disease, Lung Disease, Seizures, Tuberculosis. Social History Former smoker - quit 1989, Marital Status - Married, Alcohol Use - Never, Drug Use - Current History - marjuania, Caffeine Use - Daily - coffee. Medical History Eyes Patient has history of Cataracts - starting Denies history of Glaucoma, Optic Neuritis Ear/Nose/Mouth/Throat Denies history of Chronic sinus problems/congestion, Middle ear problems Hematologic/Lymphatic Denies history of Anemia, Hemophilia, Human Immunodeficiency Virus, Lymphedema, Sickle Cell Disease Respiratory Patient has history of Asthma Denies history of Aspiration, Chronic Obstructive Pulmonary Disease (COPD), Pneumothorax, Sleep Apnea, Tuberculosis Cardiovascular Denies history of Angina, Arrhythmia, Congestive Heart Failure, Coronary Artery Disease, Deep Vein Thrombosis, Hypertension, Hypotension, Myocardial Infarction, Peripheral Arterial Disease, Peripheral Venous Disease, Phlebitis, Vasculitis Gastrointestinal Denies history of Cirrhosis , Colitis, Crohnoos, Hepatitis A, Hepatitis B, Hepatitis C Endocrine Denies history of Type I Diabetes, Type II Diabetes Genitourinary Denies history of End Stage Renal Disease Immunological Denies history of Lupus Erythematosus, Raynaudoos, Scleroderma Integumentary (Skin) Denies history of History of Burn Musculoskeletal Patient has history of Rheumatoid Arthritis, Osteoarthritis Denies history of Gout, Osteomyelitis Neurologic Denies history of Dementia, Neuropathy, Quadriplegia, Paraplegia, Seizure Disorder Oncologic Denies history of Received Chemotherapy, Received Radiation Psychiatric Denies history of Anorexia/bulimia, Confinement Anxiety Medical A Surgical History Notes nd Constitutional Symptoms (General Health) hypothyroidism Review of Systems (ROS) Constitutional Symptoms (General Health) Denies complaints or  symptoms of Fatigue, Fever, Chills, Marked Weight Change. Eyes Complains or has symptoms of Glasses / Contacts - glasses. Ear/Nose/Mouth/Throat Denies complaints or symptoms of Chronic sinus problems or rhinitis. Respiratory Denies complaints or symptoms of Chronic or frequent coughs, Shortness of Breath. Cardiovascular Denies complaints or symptoms of Chest pain. Gastrointestinal Denies complaints or symptoms of Frequent diarrhea, Nausea, Vomiting. Endocrine Denies complaints or symptoms of Heat/cold intolerance. Genitourinary Denies complaints or symptoms of Frequent urination. Integumentary (Skin) Complains or has symptoms of Wounds - left foot. Musculoskeletal Denies complaints or symptoms of Muscle Pain, Muscle Weakness. Neurologic Denies complaints or symptoms of Numbness/parasthesias. Psychiatric Denies complaints or symptoms of Claustrophobia, Suicidal. Objective Constitutional respirations regular, non-labored and within target range for patient.. Vitals Time Taken: 10:00 AM, Height: 67 in, Source: Stated, Weight: 208 lbs, Source: Stated, BMI: 32.6, Temperature: 98.2 F, Pulse: 67 bpm, Respiratory Rate: 20 breaths/min, Blood Pressure: 121/80 mmHg. Cardiovascular 2+ dorsalis pedis/posterior tibialis pulses. Psychiatric pleasant and cooperative. General Notes: Left foot: T the medial calcaneus there is an open wound with nonviable tissue and scant granulation tissue present. No signs of surrounding o infection. No drainage noted. Integumentary (Hair, Skin) Wound #1 status is Open. Original cause of wound was Thermal Burn. The date acquired was: 11/15/2021. The wound is located on the Left,Medial Calcaneus. The wound measures 1.6cm length x 1cm width x  0.1cm depth; 1.257cm^2 area and 0.126cm^3 volume. There is Fat Layer (Subcutaneous Tissue) exposed. There is no tunneling or undermining noted. There is a medium amount of serosanguineous drainage noted. The wound margin is  distinct with the outline attached to the wound base. There is small (1-33%) pink granulation within the wound bed. There is a large (67-100%) amount of necrotic tissue within the wound bed including Eschar and Adherent Slough. Assessment Active Problems ICD-10 Non-pressure chronic ulcer of other part of left foot with fat layer exposed Burn of second degree of right foot, initial encounter Hypothyroidism, unspecified Patient presents with a 1 month history of burn to his left foot. I debrided nonviable tissue. I recommended using Santyl and Hydrofera Blue. No signs of infection on exam. I recommended avoiding pressure to the site. Follow-up in 1 week. Procedures Wound #1 Pre-procedure diagnosis of Wound #1 is a 2nd degree Burn located on the Left,Medial Calcaneus . An Dressings and/or debridement of burns; small procedure was performed by Kalman Shan, DO. Post procedure Diagnosis Wound #1: Same as Pre-Procedure Notes: currette used to debride slough, eschar, subQ tissue, and dermis. Plan Follow-up Appointments: Return Appointment in 1 week. - Dr. Heber [place] Other: [employer]- wound care bordered foam dressings. Bathing/ Shower/ Hygiene: May shower and wash wound with soap and water. - with dressing changes. Edema Control - Lymphedema / SCD / Other: Elevate legs to the level of the heart or above for 30 minutes daily and/or when sitting, a frequency of: - 3=4 times a day throughout the day. Avoid standing for long periods of time. Moisturize legs daily. - both legs every night before bed. Off-Loading: Other: - ensure no pressure or rubbing from shoes. The following medication(s) was prescribed: lidocaine topical 5 % ointment ointment topical applied only in clinic. was prescribed at facility WOUND #1: - Calcaneus Wound Laterality: Left, Medial Cleanser: Soap and Water 1 x Per Day/30 Days Discharge Instructions: May shower and wash wound with dial antibacterial soap and  water prior to dressing change. Peri-Wound Care: Skin Prep (DME) (Generic) 1 x Per Day/30 Days Discharge Instructions: Use skin prep as directed Prim Dressing: Hydrofera Blue Classic Foam, 2x2 in 1 x Per Day/30 Days ary Discharge Instructions: Moisten with saline prior to applying over the santyl. Prim Dressing: Santyl Ointment 1 x Per Day/30 Days ary Discharge Instructions: Apply nickel thick amount to wound bed then apply hydrofera blue Secondary Dressing: Zetuvit Plus Silicone Border Dressing 4x4 (in/in) (DME) (Generic) 1 x Per Day/30 Days Discharge Instructions: Apply silicone border over primary dressing as directed. 1. Santyl and Hydrofera Blue 2. Follow-up in 1 week Electronic Signature(s) Signed: 12/16/2021 12:27:20 PM By: Kalman Shan DO Entered By: Kalman Shan on 12/16/2021 12:26:30 -------------------------------------------------------------------------------- HxROS Details Patient Name: Date of Service: [name], Nils R. 12/16/2021 9:15 A M Medical Record Number: [id number] Patient Account Number: [id number] Date of Birth/Sex: Treating RN: 10-05-1951 (71 y.o. [name] Primary Care Provider: Leonard Downing Other Clinician: Referring Provider: Treating Provider/Extender: [name]: 0 Information Obtained From Patient Constitutional Symptoms (General Health) Complaints and Symptoms: Negative for: Fatigue; Fever; Chills; Marked Weight Change Medical History: Past Medical History Notes: hypothyroidism Eyes Complaints and Symptoms: Positive for: Glasses / Contacts - glasses Medical History: Positive for: Cataracts - starting Negative for: Glaucoma; Optic Neuritis Ear/Nose/Mouth/Throat Complaints and Symptoms: Negative for: Chronic sinus problems or rhinitis Medical History: Negative for: Chronic sinus problems/congestion; Middle ear problems Respiratory Complaints and Symptoms: Negative for:  Chronic or frequent coughs; Shortness of Breath Medical History: Positive for: Asthma Negative for: Aspiration; Chronic Obstructive Pulmonary Disease (COPD); Pneumothorax; Sleep Apnea; Tuberculosis Cardiovascular Complaints and Symptoms: Negative for: Chest pain Medical History: Negative for: Angina; Arrhythmia; Congestive Heart Failure; Coronary Artery Disease; Deep Vein Thrombosis; Hypertension; Hypotension; Myocardial Infarction; Peripheral Arterial Disease; Peripheral Venous Disease; Phlebitis; Vasculitis Gastrointestinal Complaints and Symptoms: Negative for: Frequent diarrhea; Nausea; Vomiting Medical History: Negative for: Cirrhosis ; Colitis; Crohns; Hepatitis A; Hepatitis B; Hepatitis C Endocrine Complaints and Symptoms: Negative for: Heat/cold intolerance Medical History: Negative for: Type I Diabetes; Type II Diabetes Genitourinary Complaints and Symptoms: Negative for: Frequent urination Medical History: Negative for: End Stage Renal Disease Integumentary (Skin) Complaints and Symptoms: Positive for: Wounds - left foot Medical History: Negative for: History of Burn Musculoskeletal Complaints and Symptoms: Negative for: Muscle Pain; Muscle Weakness Medical History: Positive for: Rheumatoid Arthritis; Osteoarthritis Negative for: Gout; Osteomyelitis Neurologic Complaints and Symptoms: Negative for: Numbness/parasthesias Medical History: Negative for: Dementia; Neuropathy; Quadriplegia; Paraplegia; Seizure Disorder Psychiatric Complaints and Symptoms: Negative for: Claustrophobia; Suicidal Medical History: Negative for: Anorexia/bulimia; Confinement Anxiety Hematologic/Lymphatic Medical History: Negative for: Anemia; Hemophilia; Human Immunodeficiency Virus; Lymphedema; Sickle Cell Disease Immunological Medical History: Negative for: Lupus Erythematosus; Raynauds; Scleroderma Oncologic Medical History: Negative for: Received Chemotherapy; Received  Radiation HBO Extended History Items Eyes: Cataracts Immunizations Pneumococcal Vaccine: Received Pneumococcal Vaccination: No Implantable Devices No devices added Family and Social History Cancer: No; Diabetes: No; Heart Disease: Yes - Father,Mother; Hereditary Spherocytosis: No; Hypertension: Yes - Mother,Father; Kidney Disease: No; Lung Disease: No; Seizures: No; Stroke: Yes - Father; Thyroid Problems: Yes - Mother; Tuberculosis: No; Former smoker - quit 1989; Marital Status - Married; Alcohol Use: Never; Drug Use: Current History - marjuania; Caffeine Use: Daily - coffee; Financial Concerns: No; Food, Clothing or Shelter Needs: No; Support System Lacking: No; Transportation Concerns: No Electronic Signature(s) Signed: 12/16/2021 12:27:20 PM By: Kalman Shan DO Signed: 12/16/2021 5:33:54 PM By: Deon Pilling RN, BSN Entered By: Deon Pilling on 12/16/2021 10:17:43 -------------------------------------------------------------------------------- SuperBill Details Patient Name: Date of Service: [name], [place] R. 12/16/2021 Medical Record Number: [id number] Patient Account Number: [id number] Date of Birth/Sex: Treating RN: 23-Jun-1951 (71 y.o. [name] Primary Care Provider: Leonard Downing Other Clinician: Referring Provider: Treating Provider/Extender: Dellia Nims Weeks in Treatment: 0 Diagnosis Coding ICD-10 Codes Code Description [phone] Non-pressure chronic ulcer of other part of left foot with fat layer exposed T25.221A Burn of second degree of right foot, initial encounter E03.9 Hypothyroidism, unspecified Facility Procedures CPT4 Code: [id number] Description: 99213 - WOUND CARE VISIT-LEV 3 EST PT Modifier: Quantity: 1 Physician Procedures : CPT4 Code Description Modifier [id number] [place] PHYS LEVEL 3 NEW PT ICD-10 Diagnosis Description L97.522 Non-pressure chronic ulcer of other part of left foot with fat layer exposed T25.221A Burn  of second degree of right foot, initial encounter E03.9  Hypothyroidism, unspecified Quantity: 1 Electronic Signature(s) Signed: 12/16/2021 12:27:20 PM By: Kalman Shan DO Entered By: Kalman Shan on 12/16/2021 12:26:50

## 2021-12-17 ENCOUNTER — Ambulatory Visit: Payer: Medicare Other | Admitting: Cardiology

## 2021-12-23 ENCOUNTER — Other Ambulatory Visit: Payer: Self-pay

## 2021-12-23 ENCOUNTER — Encounter (HOSPITAL_BASED_OUTPATIENT_CLINIC_OR_DEPARTMENT_OTHER): Payer: Medicare Other | Attending: Internal Medicine | Admitting: Internal Medicine

## 2021-12-23 DIAGNOSIS — L97522 Non-pressure chronic ulcer of other part of left foot with fat layer exposed: Secondary | ICD-10-CM | POA: Diagnosis not present

## 2021-12-23 DIAGNOSIS — E039 Hypothyroidism, unspecified: Secondary | ICD-10-CM | POA: Diagnosis not present

## 2021-12-23 NOTE — Progress Notes (Signed)
LOYCE, KLASEN ([id number]) Visit Report for 12/23/2021 Arrival Information Details Patient Name: Date of Service: [facility], [place] R. 12/23/2021 10:00 A M Medical Record Number: [id number] Patient Account Number: [id number] Date of Birth/Sex: Treating RN: 06-09-1951 (71 y.o. [name] Primary Care Provider: Leonard Downing Other Clinician: Referring Provider: Treating Provider/Extender: [name]: 1 Visit Information History Since Last Visit Added or deleted any medications: No Patient Arrived: Ambulatory Any new allergies or adverse reactions: No Arrival Time: 10:02 Had a fall or experienced change in No Transfer Assistance: None activities of daily living that may affect Patient Identification Verified: Yes risk of falls: Secondary Verification Process Completed: Yes Signs or symptoms of abuse/neglect since last visito No Patient Requires Transmission-Based Precautions: No Hospitalized since last visit: No Patient Has Alerts: No Implantable device outside of the clinic excluding No cellular tissue based products placed in the center since last visit: Has Dressing in Place as Prescribed: Yes Pain Present Now: No Electronic Signature(s) Signed: 12/23/2021 4:43:35 PM By: Lorrin Jackson Entered By: Lorrin Jackson on 12/23/2021 10:04:02 -------------------------------------------------------------------------------- Encounter Discharge Information Details Patient Name: Date of Service: [name], Olyn R. 12/23/2021 10:00 A M Medical Record Number: [id number] Patient Account Number: [id number] Date of Birth/Sex: Treating RN: [birth year] (71 y.o. [name] Primary Care Provider: Leonard Downing Other Clinician: Referring Provider: Treating Provider/Extender: [name]: 1 Encounter Discharge Information Items Post Procedure Vitals Discharge Condition:  Stable Temperature (F): 98.1 Ambulatory Status: Ambulatory Pulse (bpm): 80 Discharge Destination: Home Respiratory Rate (breaths/min): 16 Transportation: Private Auto Blood Pressure (mmHg): 111/78 Schedule Follow-up Appointment: Yes Clinical Summary of Care: Provided on 12/23/2021 Form Type Recipient Paper Patient Patient Electronic Signature(s) Signed: 12/23/2021 4:43:35 PM By: Lorrin Jackson Entered By: Lorrin Jackson on 12/23/2021 10:36:15 -------------------------------------------------------------------------------- Lower Extremity Assessment Details Patient Name: Date of Service: [name], Terris R. 12/23/2021 10:00 A M Medical Record Number: [id number] Patient Account Number: [id number] Date of Birth/Sex: Treating RN: 06/18/1951 (71 y.o. [name] Primary Care Provider: Leonard Downing Other Clinician: Referring Provider: Treating Provider/Extender: Dellia Nims Weeks in Treatment: 1 Edema Assessment Assessed: Shirlyn Goltz: Yes] Patrice Paradise: No] Edema: [Left: N] [Right: o] Calf Left: Right: Point of Measurement: 28 cm From Medial Instep 37 cm Ankle Left: Right: Point of Measurement: 9 cm From Medial Instep 23 cm Vascular Assessment Pulses: Dorsalis Pedis Palpable: [Left:Yes] Electronic Signature(s) Signed: 12/23/2021 4:43:35 PM By: Lorrin Jackson Entered By: Lorrin Jackson on 12/23/2021 10:08:52 -------------------------------------------------------------------------------- Multi Wound Chart Details Patient Name: Date of Service: [name], Naszir R. 12/23/2021 10:00 A M Medical Record Number: [id number] Patient Account Number: [id number] Date of Birth/Sex: Treating RN: [birth year] (71 y.o. [name] Primary Care Provider: Leonard Downing Other Clinician: Referring Provider: Treating Provider/Extender: Dellia Nims Weeks in Treatment: 1 Vital Signs Height(in): 67 Pulse(bpm): 80 Weight(lbs):  208 Blood Pressure(mmHg): 111/78 Body Mass Index(BMI): 32.6 Temperature(F): 98.1 Respiratory Rate(breaths/min): 16 Photos: [1:Left, Medial Calcaneus] [N/A:N/A N/A] Wound Location: [1:Thermal Burn] [N/A:N/A] Wounding Event: [1:2nd degree Burn] [N/A:N/A] Primary Etiology: [1:Cataracts, Asthma, Rheumatoid] [N/A:N/A] Comorbid History: [1:Arthritis, Osteoarthritis 11/15/2021] [N/A:N/A] Date Acquired: [1:1] [N/A:N/A] Weeks of Treatment: [1:Open] [N/A:N/A] Wound Status: [1:No] [N/A:N/A] Wound Recurrence: [1:1x0.5x0.1] [N/A:N/A] Measurements L x W x D (cm) [1:0.393] [N/A:N/A] A (cm) : rea [1:0.039] [N/A:N/A] Volume (cm) : [1:68.70%] [N/A:N/A] % Reduction in A [1:rea: 69.00%] [N/A:N/A] % Reduction in Volume: [1:Full Thickness With Exposed Support N/A] Classification: [1:Structures Medium] [N/A:N/A] Exudate A  mount: [1:Serosanguineous] [N/A:N/A] Exudate Type: [1:red, brown] [N/A:N/A] Exudate Color: [1:Distinct, outline attached] [N/A:N/A] Wound Margin: [1:Small (1-33%)] [N/A:N/A] Granulation A mount: [1:Pink] [N/A:N/A] Granulation Quality: [1:Large (67-100%)] [N/A:N/A] Necrotic A mount: [1:Fat Layer (Subcutaneous Tissue): Yes N/A] Exposed Structures: [1:Fascia: No Tendon: No Muscle: No Joint: No Bone: No Medium (34-66%)] [N/A:N/A] Epithelialization: [1:Debridement - Excisional] [N/A:N/A] Debridement: Pre-procedure Verification/Time Out 10:14 [N/A:N/A] Taken: [1:Other] [N/A:N/A] Pain Control: [1:Subcutaneous, Slough] [N/A:N/A] Tissue Debrided: [1:Skin/Subcutaneous Tissue] [N/A:N/A] Level: [1:0.5] [N/A:N/A] Debridement A (sq cm): [1:rea Curette] [N/A:N/A] Instrument: [1:Minimum] [N/A:N/A] Bleeding: [1:Pressure] [N/A:N/A] Hemostasis A chieved: [1:Procedure was tolerated well] [N/A:N/A] Debridement Treatment Response: [1:1x0.5x0.1] [N/A:N/A] Post Debridement Measurements L x W x D (cm) [1:0.039] [N/A:N/A] Post Debridement Volume: (cm) [1:Debridement] [N/A:N/A] Treatment  Notes Wound #1 (Calcaneus) Wound Laterality: Left, Medial Cleanser Soap and Water Discharge Instruction: May shower and wash wound with dial antibacterial soap and water prior to dressing change. Peri-Wound Care Skin Prep Discharge Instruction: Use skin prep as directed Topical Primary Dressing Hydrofera Blue Classic Foam, 2x2 in Discharge Instruction: Moisten with saline prior to applying over the santyl. Santyl Ointment Discharge Instruction: Apply nickel thick amount to wound bed then apply hydrofera blue Secondary Dressing Zetuvit Plus Silicone Border Dressing 4x4 (in/in) Discharge Instruction: Apply silicone border over primary dressing as directed. Secured With Compression Wrap Compression Stockings Add-Ons Electronic Signature(s) Signed: 12/23/2021 12:07:08 PM By: Kalman Shan DO Signed: 12/23/2021 4:43:35 PM By: Lorrin Jackson Entered By: Kalman Shan on 12/23/2021 12:02:12 -------------------------------------------------------------------------------- Multi-Disciplinary Care Plan Details Patient Name: Date of Service: [facility], Skip R. 12/23/2021 10:00 A M Medical Record Number: [id number] Patient Account Number: [id number] Date of Birth/Sex: Treating RN: 1951/04/22 (71 y.o. [name] Primary Care Thirza Pellicano: Leonard Downing Other Clinician: Referring Ikhlas Albo: Treating Lyndsay Talamante/Extender: [name]: 1 Active Inactive Orientation to the Wound Care Program Nursing Diagnoses: Knowledge deficit related to the wound healing center program Goals: Patient/caregiver will verbalize understanding of the [place] Date Initiated: 12/16/2021 Target Resolution Date: 01/13/2022 Goal Status: Active Interventions: Provide education on orientation to the wound center Notes: Pain, Acute or Chronic Nursing Diagnoses: Pain, acute or chronic: actual or potential Potential alteration in comfort,  pain Goals: Patient will verbalize adequate pain control and receive pain control interventions during procedures as needed Date Initiated: 12/16/2021 Target Resolution Date: 01/13/2022 Goal Status: Active Patient/caregiver will verbalize comfort level met Date Initiated: 12/16/2021 Target Resolution Date: 01/13/2022 Goal Status: Active Interventions: Encourage patient to take pain medications as prescribed Provide education on pain management Treatment Activities: Administer pain control measures as ordered : 12/16/2021 Notes: Wound/Skin Impairment Nursing Diagnoses: Knowledge deficit related to ulceration/compromised skin integrity Goals: Patient/caregiver will verbalize understanding of skin care regimen Date Initiated: 12/16/2021 Target Resolution Date: 01/13/2022 Goal Status: Active Interventions: Assess patient/caregiver ability to obtain necessary supplies Assess patient/caregiver ability to perform ulcer/skin care regimen upon admission and as needed Provide education on ulcer and skin care Treatment Activities: Skin care regimen initiated : 12/16/2021 Topical wound management initiated : 12/16/2021 Notes: Electronic Signature(s) Signed: 12/23/2021 4:43:35 PM By: Lorrin Jackson Entered By: Lorrin Jackson on 12/23/2021 10:22:49 -------------------------------------------------------------------------------- Pain Assessment Details Patient Name: Date of Service: [name], Mattis R. 12/23/2021 10:00 A M Medical Record Number: [id number] Patient Account Number: [id number] Date of Birth/Sex: Treating RN: Mar 24, 1951 (71 y.o. [name] Primary Care Adasyn Mcadams: Leonard Downing Other Clinician: Referring Bryli Mantey: Treating Kameelah Minish/Extender: Dellia Nims Weeks in Treatment: 1 Active Problems Location of Pain Severity and Description of Pain  Patient Has Paino Yes Site Locations Pain Location: Pain in Ulcers With Dressing Change: Yes Duration  of the Pain. Constant / Intermittento Intermittent Rate the pain. Current Pain Level: 2 Character of Pain Describe the Pain: Tender, Throbbing Pain Management and Medication Current Pain Management: Medication: Yes Cold Application: No Rest: Yes Massage: No Activity: No T.E.N.S.: No Heat Application: No Leg drop or elevation: No Is the Current Pain Management Adequate: Adequate How does your wound impact your activities of daily livingo Sleep: No Bathing: No Appetite: No Relationship With Others: No Bladder Continence: No Emotions: No Bowel Continence: No Work: No Toileting: No Drive: No Dressing: No Hobbies: No Electronic Signature(s) Signed: 12/23/2021 4:43:35 PM By: Lorrin Jackson Entered By: Lorrin Jackson on 12/23/2021 10:08:35 -------------------------------------------------------------------------------- Patient/Caregiver Education Details Patient Name: Date of Service: [name], Riel R. 2/6/2023andnbsp10:00 A M Medical Record Number: [id number] Patient Account Number: [id number] Date of Birth/Gender: Treating RN: Mar 09, 1951 (71 y.o. [name] Primary Care Physician: Leonard Downing Other Clinician: Referring Physician: Treating Physician/Extender: [name]: 1 Education Assessment Education Provided To: Patient Education Topics Provided Wound/Skin Impairment: Methods: Demonstration, Explain/Verbal, Printed Responses: State content correctly Electronic Signature(s) Signed: 12/23/2021 4:43:35 PM By: Lorrin Jackson Entered By: Lorrin Jackson on 12/23/2021 10:02:26 -------------------------------------------------------------------------------- Wound Assessment Details Patient Name: Date of Service: [name], Atsushi R. 12/23/2021 10:00 A M Medical Record Number: [id number] Patient Account Number: [id number] Date of Birth/Sex: Treating RN: 07/11/1951 (71 y.o. [name] Primary Care  Provider: Leonard Downing Other Clinician: Referring Provider: Treating Provider/Extender: Dellia Nims Weeks in Treatment: 1 Wound Status Wound Number: 1 Primary Etiology: 2nd degree Burn Wound Location: Left, Medial Calcaneus Wound Status: Open Wounding Event: Thermal Burn Comorbid History: Cataracts, Asthma, Rheumatoid Arthritis, Osteoarthritis Date Acquired: 11/15/2021 Weeks Of Treatment: 1 Clustered Wound: No Photos Wound Measurements Length: (cm) 1 Width: (cm) 0.5 Depth: (cm) 0.1 Area: (cm) 0.393 Volume: (cm) 0.039 % Reduction in Area: 68.7% % Reduction in Volume: 69% Epithelialization: Medium (34-66%) Tunneling: No Undermining: No Wound Description Classification: Full Thickness With Exposed Support Structures Wound Margin: Distinct, outline attached Exudate Amount: Medium Exudate Type: Serosanguineous Exudate Color: red, brown Foul Odor After Cleansing: No Slough/Fibrino Yes Wound Bed Granulation Amount: Small (1-33%) Exposed Structure Granulation Quality: Pink Fascia Exposed: No Necrotic Amount: Large (67-100%) Fat Layer (Subcutaneous Tissue) Exposed: Yes Necrotic Quality: Adherent Slough Tendon Exposed: No Muscle Exposed: No Joint Exposed: No Bone Exposed: No Treatment Notes Wound #1 (Calcaneus) Wound Laterality: Left, Medial Cleanser Soap and Water Discharge Instruction: May shower and wash wound with dial antibacterial soap and water prior to dressing change. Peri-Wound Care Skin Prep Discharge Instruction: Use skin prep as directed Topical Primary Dressing Hydrofera Blue Classic Foam, 2x2 in Discharge Instruction: Moisten with saline prior to applying over the santyl. Santyl Ointment Discharge Instruction: Apply nickel thick amount to wound bed then apply hydrofera blue Secondary Dressing Zetuvit Plus Silicone Border Dressing 4x4 (in/in) Discharge Instruction: Apply silicone border over primary dressing as  directed. Secured With Compression Wrap Compression Stockings Environmental education officer) Signed: 12/23/2021 4:43:35 PM By: Lorrin Jackson Entered By: Lorrin Jackson on 12/23/2021 10:11:41 -------------------------------------------------------------------------------- Vitals Details Patient Name: Date of Service: [name], Jun R. 12/23/2021 10:00 A M Medical Record Number: [id number] Patient Account Number: [id number] Date of Birth/Sex: Treating RN: 1951/01/25 (71 y.o. [name] Primary Care Provider: Leonard Downing Other Clinician: Referring Provider: Treating Provider/Extender: Dellia Nims Weeks in Treatment: 1 Vital Signs Time  Taken: 10:04 Temperature (F): 98.1 Height (in): 67 Pulse (bpm): 80 Weight (lbs): 208 Respiratory Rate (breaths/min): 16 Body Mass Index (BMI): 32.6 Blood Pressure (mmHg): 111/78 Reference Range: 80 - 120 mg / dl Electronic Signature(s) Signed: 12/23/2021 4:43:35 PM By: Lorrin Jackson Entered By: Lorrin Jackson on 12/23/2021 10:05:50

## 2021-12-23 NOTE — Progress Notes (Signed)
EON, ZUNKER ([id number]) Visit Report for 12/23/2021 Chief Complaint Document Details Patient Name: Date of Service: [facility] [name] 12/23/2021 10:00 A M Medical Record Number: [id number] Patient Account Number: [id number] Date of Birth/Sex: Treating RN: Mar 26, 1951 (71 y.o. [name] Haney Primary Care Provider: Leonard Haney Other Clinician: Referring Provider: Treating Provider/Extender: [name] Haney in Treatment: 1 Information Obtained from: Patient Chief Complaint Left foot wound Electronic Signature(s) Signed: 12/23/2021 12:07:08 PM By: [name] Shan DO Entered By: [name] Haney on 12/23/2021 12:02:24 -------------------------------------------------------------------------------- Debridement Details Patient Name: Date of Service: [name], [name] 12/23/2021 10:00 A M Medical Record Number: [id number] Patient Account Number: [id number] Date of Birth/Sex: Treating RN: 1951-03-16 (71 y.o. [name] Haney Primary Care Provider: Leonard Haney Other Clinician: Referring Provider: Treating Provider/Extender: [name] Haney in Treatment: 1 Debridement Performed for Assessment: Wound #1 Left,Medial Calcaneus Performed By: Physician [name] Shan, DO Debridement Type: Debridement Level of Consciousness (Pre-procedure): Awake and Alert Pre-procedure Verification/Time Out Yes - 10:14 Taken: Start Time: 10:15 Pain Control: Other : Benzocaine T Area Debrided (L x W): otal 1 (cm) x 0.5 (cm) = 0.5 (cm) Tissue and other material debrided: Non-Viable, Slough, Subcutaneous, Slough Level: Skin/Subcutaneous Tissue Debridement Description: Excisional Instrument: Curette Bleeding: Minimum Hemostasis Achieved: Pressure End Time: 10:21 Response to Treatment: Procedure was tolerated well Level of Consciousness (Post- Awake and Alert procedure): Post Debridement Measurements of Total Wound Length:  (cm) 1 Width: (cm) 0.5 Depth: (cm) 0.1 Volume: (cm) 0.039 Character of Wound/Ulcer Post Debridement: Stable Post Procedure Diagnosis Same as Pre-procedure Electronic Signature(s) Signed: 12/23/2021 12:07:08 PM By: [name] Shan DO Signed: 12/23/2021 4:43:35 PM By: [name] Haney Entered By: [name] Haney on 12/23/2021 10:22:06 -------------------------------------------------------------------------------- HPI Details Patient Name: Date of Service: [name], [name] 12/23/2021 10:00 A M Medical Record Number: [id number] Patient Account Number: [id number] Date of Birth/Sex: Treating RN: [birth year] (71 y.o. [name] Haney Primary Care Provider: Leonard Haney Other Clinician: Referring Provider: Treating Provider/Extender: [name] Haney in Treatment: 1 History of Present Illness HPI Description: Admission 12/16/2021 [name] is a 71 year old male with a past medical history of hypothyroidism and chronic pain syndrome that presents to the clinic for a burn to his left foot one month ago. He states he has electric socks that keep his feet warm and these caused the burn. He denies a history of neuropathy. He started using an ointment recently to the burn site but is not sure of the name. Prior to that he has just been keeping the area covered. The wound has been stable over the past month with little improvement in healing. He currently denies signs of infection. 2/6; patient presents for follow-up. He states he is having trouble keeping the [facility] in place. He denies signs of infection. He reports improvement in wound healing. Electronic Signature(s) Signed: 12/23/2021 12:07:08 PM By: [name] Shan DO Entered By: [name] Haney on 12/23/2021 12:03:25 -------------------------------------------------------------------------------- Physical Exam Details Patient Name: Date of Service: [name], [name] 12/23/2021 10:00 A M Medical  Record Number: [id number] Patient Account Number: [id number] Date of Birth/Sex: Treating RN: [birth year] (71 y.o. [name] Haney Primary Care Provider: Leonard Haney Other Clinician: Referring Provider: Treating Provider/Extender: [name] Haney in Treatment: 1 Constitutional respirations regular, non-labored and within target range for patient.. Cardiovascular 2+ dorsalis pedis/posterior tibialis pulses. Psychiatric pleasant and cooperative. Notes Left foot: T the medial calcaneus there is an open wound with nonviable  tissue and scant granulation tissue present. No signs of surrounding infection. No o drainage noted. Electronic Signature(s) Signed: 12/23/2021 12:07:08 PM By: [name] Shan DO Entered By: [name] Haney on 12/23/2021 12:04:34 -------------------------------------------------------------------------------- Physician Orders Details Patient Name: Date of Service: [name], [name] 12/23/2021 10:00 A M Medical Record Number: [id number] Patient Account Number: [id number] Date of Birth/Sex: Treating RN: October 03, 1951 (71 y.o. [name] Haney Primary Care Provider: Leonard Haney Other Clinician: Referring Provider: Treating Provider/Extender: [name] Haney in Treatment: 1 Verbal / Phone Orders: No Diagnosis Coding ICD-10 Coding Code Description [phone] Non-pressure chronic ulcer of other part of left foot with fat layer exposed T25.221A Burn of second degree of right foot, initial encounter E03.9 Hypothyroidism, unspecified Follow-up Appointments ppointment in 1 week. - Dr. Heber Haney Return A Other: - [employer]- wound care bordered foam dressings. Bathing/ Shower/ Hygiene May shower and wash wound with soap and water. - with dressing changes. Edema Control - Lymphedema / SCD / Other Elevate legs to the level of the heart or above for 30 minutes daily and/or when sitting, a  frequency of: - 3=4 times a day throughout the day. Avoid standing for long periods of time. Moisturize legs daily. - both legs every night before bed. Off-Loading Other: - ensure no pressure or rubbing from shoes. Wound Treatment Wound #1 - Calcaneus Wound Laterality: Left, Medial Cleanser: Soap and Water 1 x Per Day/30 Days Discharge Instructions: May shower and wash wound with dial antibacterial soap and water prior to dressing change. Peri-Wound Care: Skin Prep (Generic) 1 x Per Day/30 Days Discharge Instructions: Use skin prep as directed Prim Dressing: Hydrofera Blue Classic Foam, 2x2 in 1 x Per Day/30 Days ary Discharge Instructions: Moisten with saline prior to applying over the santyl. Prim Dressing: Santyl Ointment 1 x Per Day/30 Days ary Discharge Instructions: Apply nickel thick amount to wound bed then apply hydrofera blue Secondary Dressing: Zetuvit Plus Silicone Border Dressing 4x4 (in/in) (Generic) 1 x Per Day/30 Days Discharge Instructions: Apply silicone border over primary dressing as directed. Electronic Signature(s) Signed: 12/23/2021 12:07:08 PM By: [name] Shan DO Entered By: [name] Haney on 12/23/2021 12:04:53 -------------------------------------------------------------------------------- Problem List Details Patient Name: Date of Service: [name], [name] 12/23/2021 10:00 A M Medical Record Number: [id number] Patient Account Number: [id number] Date of Birth/Sex: Treating RN: [birth year] (71 y.o. [name] Haney Primary Care Provider: Other Clinician: Leonard Haney Referring Provider: Treating Provider/Extender: [name] Haney in Treatment: 1 Active Problems ICD-10 Encounter Code Description Active Date MDM Diagnosis [phone] Non-pressure chronic ulcer of other part of left foot with fat layer exposed 12/16/2021 No Yes T25.221A Burn of second degree of right foot, initial encounter 12/16/2021 No Yes E03.9  Hypothyroidism, unspecified 12/16/2021 No Yes Inactive Problems Resolved Problems Electronic Signature(s) Signed: 12/23/2021 12:07:08 PM By: [name] Shan DO Entered By: [name] Haney on 12/23/2021 12:02:05 -------------------------------------------------------------------------------- Progress Note Details Patient Name: Date of Service: [name], [name] 12/23/2021 10:00 A M Medical Record Number: [id number] Patient Account Number: [id number] Date of Birth/Sex: Treating RN: 10/13/1951 (71 y.o. [name] Haney Primary Care Provider: Leonard Haney Other Clinician: Referring Provider: Treating Provider/Extender: [name] Haney in Treatment: 1 Subjective Chief Complaint Information obtained from Patient Left foot wound History of Present Illness (HPI) Admission 12/16/2021 Mr. Antoinne Spadaccini is a 71 year old male with a past medical history of hypothyroidism and chronic pain syndrome that presents to the clinic for a burn to his left foot  one month ago. He states he has electric socks that keep his feet warm and these caused the burn. He denies a history of neuropathy. He started using an ointment recently to the burn site but is not sure of the name. Prior to that he has just been keeping the area covered. The wound has been stable over the past month with little improvement in healing. He currently denies signs of infection. 2/6; patient presents for follow-up. He states he is having trouble keeping the [facility] in place. He denies signs of infection. He reports improvement in wound healing. Patient History Information obtained from Patient. Family History Heart Disease - Father,Mother, Hypertension - Mother,Father, Stroke - Father, Thyroid Problems - Mother, No family history of Cancer, Diabetes, Hereditary Spherocytosis, Kidney Disease, Lung Disease, Seizures, Tuberculosis. Social History Former smoker - quit 1989, Marital Status -  Married, Alcohol Use - Never, Drug Use - Current History - marjuania, Caffeine Use - Daily - coffee. Medical History Eyes Patient has history of Cataracts - starting Denies history of Glaucoma, Optic Neuritis Ear/Nose/Mouth/Throat Denies history of Chronic sinus problems/congestion, Middle ear problems Hematologic/Lymphatic Denies history of Anemia, Hemophilia, Human Immunodeficiency Virus, Lymphedema, Sickle Cell Disease Respiratory Patient has history of Asthma Denies history of Aspiration, Chronic Obstructive Pulmonary Disease (COPD), Pneumothorax, Sleep Apnea, Tuberculosis Cardiovascular Denies history of Angina, Arrhythmia, Congestive Heart Failure, Coronary Artery Disease, Deep Vein Thrombosis, Hypertension, Hypotension, Myocardial Infarction, Peripheral Arterial Disease, Peripheral Venous Disease, Phlebitis, Vasculitis Gastrointestinal Denies history of Cirrhosis , Colitis, Crohnoos, Hepatitis A, Hepatitis B, Hepatitis C Endocrine Denies history of Type I Diabetes, Type II Diabetes Genitourinary Denies history of End Stage Renal Disease Immunological Denies history of Lupus Erythematosus, Raynaudoos, Scleroderma Integumentary (Skin) Denies history of History of Burn Musculoskeletal Patient has history of Rheumatoid Arthritis, Osteoarthritis Denies history of Gout, Osteomyelitis Neurologic Denies history of Dementia, Neuropathy, Quadriplegia, Paraplegia, Seizure Disorder Oncologic Denies history of Received Chemotherapy, Received Radiation Psychiatric Denies history of Anorexia/bulimia, Confinement Anxiety Medical A Surgical History Notes nd Constitutional Symptoms (General Health) hypothyroidism Objective Constitutional respirations regular, non-labored and within target range for patient.. Vitals Time Taken: 10:04 AM, Height: 67 in, Weight: 208 lbs, BMI: 32.6, Temperature: 98.1 F, Pulse: 80 bpm, Respiratory Rate: 16 breaths/min, Blood Pressure: 111/78  mmHg. Cardiovascular 2+ dorsalis pedis/posterior tibialis pulses. Psychiatric pleasant and cooperative. General Notes: Left foot: T the medial calcaneus there is an open wound with nonviable tissue and scant granulation tissue present. No signs of surrounding o infection. No drainage noted. Integumentary (Hair, Skin) Wound #1 status is Open. Original cause of wound was Thermal Burn. The date acquired was: 11/15/2021. The wound has been in treatment 1 Haney. The wound is located on the Left,Medial Calcaneus. The wound measures 1cm length x 0.5cm width x 0.1cm depth; 0.393cm^2 area and 0.039cm^3 volume. There is Fat Layer (Subcutaneous Tissue) exposed. There is no tunneling or undermining noted. There is a medium amount of serosanguineous drainage noted. The wound margin is distinct with the outline attached to the wound base. There is small (1-33%) pink granulation within the wound bed. There is a large (67-100%) amount of necrotic tissue within the wound bed including Adherent Slough. Assessment Active Problems ICD-10 Non-pressure chronic ulcer of other part of left foot with fat layer exposed Burn of second degree of right foot, initial encounter Hypothyroidism, unspecified Patient's wound has shown improvement in size and appearance since last clinic visit. I debrided nonviable tissue. No surrounding signs of infection. I recommended continuing with Hydrofera Blue. I recommended using  rolled gauze to keep this in place. Follow-up in 1 week. Procedures Wound #1 Pre-procedure diagnosis of Wound #1 is a 2nd degree Burn located on the Left,Medial Calcaneus . There was a Excisional Skin/Subcutaneous Tissue Debridement with a total area of 0.5 sq cm performed by [name] Shan, DO. With the following instrument(s): Curette to remove Non-Viable tissue/material. Material removed includes Subcutaneous Tissue and Slough and after achieving pain control using Other (Benzocaine). No specimens  were taken. A time out was conducted at 10:14, prior to the start of the procedure. A Minimum amount of bleeding was controlled with Pressure. The procedure was tolerated well. Post Debridement Measurements: 1cm length x 0.5cm width x 0.1cm depth; 0.039cm^3 volume. Character of Wound/Ulcer Post Debridement is stable. Post procedure Diagnosis Wound #1: Same as Pre-Procedure Plan Follow-up Appointments: Return Appointment in 1 week. - Dr. Heber [place] Other: [employer]- wound care bordered foam dressings. Bathing/ Shower/ Hygiene: May shower and wash wound with soap and water. - with dressing changes. Edema Control - Lymphedema / SCD / Other: Elevate legs to the level of the heart or above for 30 minutes daily and/or when sitting, a frequency of: - 3=4 times a day throughout the day. Avoid standing for long periods of time. Moisturize legs daily. - both legs every night before bed. Off-Loading: Other: - ensure no pressure or rubbing from shoes. WOUND #1: - Calcaneus Wound Laterality: Left, Medial Cleanser: Soap and Water 1 x Per Day/30 Days Discharge Instructions: May shower and wash wound with dial antibacterial soap and water prior to dressing change. Peri-Wound Care: Skin Prep (Generic) 1 x Per Day/30 Days Discharge Instructions: Use skin prep as directed Prim Dressing: Hydrofera Blue Classic Foam, 2x2 in 1 x Per Day/30 Days ary Discharge Instructions: Moisten with saline prior to applying over the santyl. Prim Dressing: Santyl Ointment 1 x Per Day/30 Days ary Discharge Instructions: Apply nickel thick amount to wound bed then apply hydrofera blue Secondary Dressing: Zetuvit Plus Silicone Border Dressing 4x4 (in/in) (Generic) 1 x Per Day/30 Days Discharge Instructions: Apply silicone border over primary dressing as directed. 1. Enough for sharp debridement 2. Hydrofera Blue 3. Follow-up in 1 week Electronic Signature(s) Signed: 12/23/2021 12:07:08 PM By: [name] Shan  DO Entered By: [name] Haney on 12/23/2021 12:06:16 -------------------------------------------------------------------------------- HxROS Details Patient Name: Date of Service: [name], [name] 12/23/2021 10:00 A M Medical Record Number: [id number] Patient Account Number: [id number] Date of Birth/Sex: Treating RN: November 11, 1951 (71 y.o. [name] Haney Primary Care Provider: Leonard Haney Other Clinician: Referring Provider: Treating Provider/Extender: [name] Haney in Treatment: 1 Information Obtained From Patient Constitutional Symptoms (General Health) Medical History: Past Medical History Notes: hypothyroidism Eyes Medical History: Positive for: Cataracts - starting Negative for: Glaucoma; Optic Neuritis Ear/Nose/Mouth/Throat Medical History: Negative for: Chronic sinus problems/congestion; Middle ear problems Hematologic/Lymphatic Medical History: Negative for: Anemia; Hemophilia; Human Immunodeficiency Virus; Lymphedema; Sickle Cell Disease Respiratory Medical History: Positive for: Asthma Negative for: Aspiration; Chronic Obstructive Pulmonary Disease (COPD); Pneumothorax; Sleep Apnea; Tuberculosis Cardiovascular Medical History: Negative for: Angina; Arrhythmia; Congestive Heart Failure; Coronary Artery Disease; Deep Vein Thrombosis; Hypertension; Hypotension; Myocardial Infarction; Peripheral Arterial Disease; Peripheral Venous Disease; Phlebitis; Vasculitis Gastrointestinal Medical History: Negative for: Cirrhosis ; Colitis; Crohns; Hepatitis A; Hepatitis B; Hepatitis C Endocrine Medical History: Negative for: Type I Diabetes; Type II Diabetes Genitourinary Medical History: Negative for: End Stage Renal Disease Immunological Medical History: Negative for: Lupus Erythematosus; Raynauds; Scleroderma Integumentary (Skin) Medical History: Negative for: History of Burn Musculoskeletal Medical  History: Positive for:  Rheumatoid Arthritis; Osteoarthritis Negative for: Gout; Osteomyelitis Neurologic Medical History: Negative for: Dementia; Neuropathy; Quadriplegia; Paraplegia; Seizure Disorder Oncologic Medical History: Negative for: Received Chemotherapy; Received Radiation Psychiatric Medical History: Negative for: Anorexia/bulimia; Confinement Anxiety HBO Extended History Items Eyes: Cataracts Immunizations Pneumococcal Vaccine: Received Pneumococcal Vaccination: No Implantable Devices No devices added Family and Social History Cancer: No; Diabetes: No; Heart Disease: Yes - Father,Mother; Hereditary Spherocytosis: No; Hypertension: Yes - Mother,Father; Kidney Disease: No; Lung Disease: No; Seizures: No; Stroke: Yes - Father; Thyroid Problems: Yes - Mother; Tuberculosis: No; Former smoker - quit 1989; Marital Status - Married; Alcohol Use: Never; Drug Use: Current History - marjuania; Caffeine Use: Daily - coffee; Financial Concerns: No; Food, Clothing or Shelter Needs: No; Support System Lacking: No; Transportation Concerns: No Electronic Signature(s) Signed: 12/23/2021 12:07:08 PM By: [name] Shan DO Signed: 12/23/2021 4:43:35 PM By: [name] Haney Entered By: [name] Haney on 12/23/2021 12:03:34 -------------------------------------------------------------------------------- SuperBill Details Patient Name: Date of Service: [name], [name] 12/23/2021 Medical Record Number: [id number] Patient Account Number: [id number] Date of Birth/Sex: Treating RN: 05-30-1951 (71 y.o. [name] Haney Primary Care Provider: Leonard Haney Other Clinician: Referring Provider: Treating Provider/Extender: [name] Haney in Treatment: 1 Diagnosis Coding ICD-10 Codes Code Description [phone] Non-pressure chronic ulcer of other part of left foot with fat layer exposed T25.221A Burn of second degree of right foot, initial encounter E03.9 Hypothyroidism,  unspecified Facility Procedures CPT4 Code: [id number] Description: [place] - DEB SUBQ TISSUE 20 SQ CM/< ICD-10 Diagnosis Description L97.522 Non-pressure chronic ulcer of other part of left foot with fat layer exposed Modifier: Quantity: 1 Physician Procedures : CPT4 Code Description Modifier [id number] - WC PHYS SUBQ TISS 20 SQ CM ICD-10 Diagnosis Description L97.522 Non-pressure chronic ulcer of other part of left foot with fat layer exposed Quantity: 1 Electronic Signature(s) Signed: 12/23/2021 12:07:08 PM By: [name] Shan DO Entered By: [name] Haney on 12/23/2021 12:06:25

## 2021-12-24 ENCOUNTER — Ambulatory Visit: Payer: Medicare Other | Admitting: Cardiology

## 2021-12-24 ENCOUNTER — Encounter: Payer: Self-pay | Admitting: Cardiology

## 2021-12-24 VITALS — BP 116/80 | HR 75 | Temp 98.3°F | Resp 16 | Ht 67.0 in | Wt 216.0 lb

## 2021-12-24 DIAGNOSIS — F1210 Cannabis abuse, uncomplicated: Secondary | ICD-10-CM

## 2021-12-24 DIAGNOSIS — E7800 Pure hypercholesterolemia, unspecified: Secondary | ICD-10-CM

## 2021-12-24 DIAGNOSIS — E039 Hypothyroidism, unspecified: Secondary | ICD-10-CM

## 2021-12-24 DIAGNOSIS — Z789 Other specified health status: Secondary | ICD-10-CM
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
## 2021-12-30 ENCOUNTER — Other Ambulatory Visit: Payer: Self-pay

## 2021-12-30 ENCOUNTER — Encounter (HOSPITAL_BASED_OUTPATIENT_CLINIC_OR_DEPARTMENT_OTHER): Payer: Medicare Other | Admitting: Internal Medicine

## 2021-12-30 DIAGNOSIS — L97522 Non-pressure chronic ulcer of other part of left foot with fat layer exposed: Secondary | ICD-10-CM | POA: Diagnosis not present

## 2021-12-30 DIAGNOSIS — T25221A Burn of second degree of right foot, initial encounter: Secondary | ICD-10-CM | POA: Diagnosis not present

## 2021-12-30 NOTE — Progress Notes (Signed)
HODGES, TREIBER ([id number]) Visit Report for 12/30/2021 Chief Complaint Document Details Patient Name: Date of Service: [name] Haney, [name] 12/30/2021 10:15 A M Medical Record Number: [id number] Patient Account Number: [id number] Date of Birth/Sex: Treating RN: May 25, 1951 (71 y.o. [name] Primary Care Provider: Leonard Downing Other Clinician: Referring Provider: Treating Provider/Extender: [name]: 2 Information Obtained from: Patient Chief Complaint Left foot wound Electronic Signature(s) Signed: 12/30/2021 12:33:39 PM By: Kalman Shan DO Entered By: Kalman Shan on 12/30/2021 12:29:39 -------------------------------------------------------------------------------- Debridement Details Patient Name: Date of Service: [name] Haney, [name] 12/30/2021 10:15 A M Medical Record Number: [id number] Patient Account Number: [id number] Date of Birth/Sex: Treating RN: Jan 28, 1951 (71 y.o. [name] Primary Care Provider: Leonard Downing Other Clinician: Referring Provider: Treating Provider/Extender: Dellia Nims Weeks in Treatment: 2 Debridement Performed for Assessment: Wound #1 Left,Medial Calcaneus Performed By: Physician Kalman Shan, DO Debridement Type: Debridement Level of Consciousness (Pre-procedure): Awake and Alert Pre-procedure Verification/Time Out Yes - 11:13 Taken: Start Time: 11:14 Pain Control: Other : Benzocaine T Area Debrided (L x W): otal 0.9 (cm) x 0.4 (cm) = 0.36 (cm) Tissue and other material debrided: Non-Viable, Slough, Subcutaneous, Slough Level: Skin/Subcutaneous Tissue Debridement Description: Excisional Instrument: Curette Bleeding: Minimum Hemostasis Achieved: Pressure End Time: 11:21 Response to Treatment: Procedure was tolerated well Level of Consciousness (Post- Awake and Alert procedure): Post Debridement Measurements of Total  Wound Length: (cm) 0.9 Width: (cm) 0.4 Depth: (cm) 0.1 Volume: (cm) 0.028 Character of Wound/Ulcer Post Debridement: Stable Post Procedure Diagnosis Same as Pre-procedure Electronic Signature(s) Signed: 12/30/2021 12:33:39 PM By: Kalman Shan DO Signed: 12/30/2021 5:31:57 PM By: Lorrin Jackson Entered By: Lorrin Jackson on 12/30/2021 11:21:34 -------------------------------------------------------------------------------- HPI Details Patient Name: Date of Service: [name] Haney, [name] 12/30/2021 10:15 A M Medical Record Number: [id number] Patient Account Number: [id number] Date of Birth/Sex: Treating RN: August 31, 1951 (71 y.o. [name] Primary Care Provider: Leonard Downing Other Clinician: Referring Provider: Treating Provider/Extender: [name]: 2 History of Present Illness HPI Description: Admission 12/16/2021 [name] Haney is a 71 year old male with a past medical history of hypothyroidism and chronic pain syndrome that presents to the clinic for a burn to his left foot one month ago. He states he has electric socks that keep his feet warm and these caused the burn. He denies a history of neuropathy. He started using an ointment recently to the burn site but is not sure of the name. Prior to that he has just been keeping the area covered. The wound has been stable over the past month with little improvement in healing. He currently denies signs of infection. 2/6; patient presents for follow-up. He states he is having trouble keeping the [facility] in place. He denies signs of infection. He reports improvement in wound healing. 2/13; patient presents for follow-up. He has no issues or complaints today. He has found a Band-Aid that has helped keep Hydrofera Blue nicely in place. He denies signs of infection. He reports improvement in wound healing. Electronic Signature(s) Signed: 12/30/2021 12:33:39 PM By: Kalman Shan DO Entered By: Kalman Shan on 12/30/2021 12:30:07 -------------------------------------------------------------------------------- Physical Exam Details Patient Name: Date of Service: [name] Haney, [name] 12/30/2021 10:15 A M Medical Record Number: [id number] Patient Account Number: [id number] Date of Birth/Sex: Treating RN: 16-Jun-1951 (71 y.o. [name] Primary Care Provider: Leonard Downing Other Clinician: Referring Provider: Treating Provider/Extender: [name]  in Treatment: 2 Constitutional respirations regular, non-labored and within target range for patient.Marland Kitchen Psychiatric pleasant and cooperative. Notes Left foot: T the medial calcaneus there is an open wound with nonviable tissue and granulation tissue present. No signs of surrounding infection. No drainage o noted. Electronic Signature(s) Signed: 12/30/2021 12:33:39 PM By: Kalman Shan DO Entered By: Kalman Shan on 12/30/2021 12:30:35 -------------------------------------------------------------------------------- Physician Orders Details Patient Name: Date of Service: [name] Haney, [name] 12/30/2021 10:15 A M Medical Record Number: [id number] Patient Account Number: [id number] Date of Birth/Sex: Treating RN: November 23, 1950 (71 y.o. [name] Primary Care Provider: Leonard Downing Other Clinician: Referring Provider: Treating Provider/Extender: [name]: 2 Verbal / Phone Orders: No Diagnosis Coding ICD-10 Coding Code Description [id number] Non-pressure chronic ulcer of other part of left foot with fat layer exposed T25.221A Burn of second degree of right foot, initial encounter E03.9 Hypothyroidism, unspecified Follow-up Appointments ppointment in 1 week. - Dr. Heber Blanchard Return A Other: - [employer]- wound care bordered foam dressings. Bathing/ Shower/ Hygiene May shower and wash wound  with soap and water. - with dressing changes. Edema Control - Lymphedema / SCD / Other Elevate legs to the level of the heart or above for 30 minutes daily and/or when sitting, a frequency of: - 3=4 times a day throughout the day. Avoid standing for long periods of time. Moisturize legs daily. - both legs every night before bed. Off-Loading Other: - ensure no pressure or rubbing from shoes. Wound Treatment Wound #1 - Calcaneus Wound Laterality: Left, Medial Cleanser: Soap and Water 1 x Per Day/30 Days Discharge Instructions: May shower and wash wound with dial antibacterial soap and water prior to dressing change. Peri-Wound Care: Skin Prep (Generic) 1 x Per Day/30 Days Discharge Instructions: Use skin prep as directed Prim Dressing: Hydrofera Blue Classic Foam, 2x2 in 1 x Per Day/30 Days ary Discharge Instructions: Moisten with saline prior to applying over the santyl. Prim Dressing: Santyl Ointment 1 x Per Day/30 Days ary Discharge Instructions: Apply nickel thick amount to wound bed then apply hydrofera blue Secondary Dressing: Zetuvit Plus Silicone Border Dressing 4x4 (in/in) (Generic) 1 x Per Day/30 Days Discharge Instructions: Apply silicone border over primary dressing as directed. Electronic Signature(s) Signed: 12/30/2021 12:33:39 PM By: Kalman Shan DO Entered By: Kalman Shan on 12/30/2021 12:31:30 -------------------------------------------------------------------------------- Problem List Details Patient Name: Date of Service: [name] Haney, [name] 12/30/2021 10:15 A M Medical Record Number: [id number] Patient Account Number: [id number] Date of Birth/Sex: Treating RN: 04/25/1951 (71 y.o. [name] Primary Care Provider: Leonard Downing Other Clinician: Referring Provider: Treating Provider/Extender: [name]: 2 Active Problems ICD-10 Encounter Code Description Active Date MDM Diagnosis [phone]  Non-pressure chronic ulcer of other part of left foot with fat layer exposed 12/16/2021 No Yes T25.221A Burn of second degree of right foot, initial encounter 12/16/2021 No Yes E03.9 Hypothyroidism, unspecified 12/16/2021 No Yes Inactive Problems Resolved Problems Electronic Signature(s) Signed: 12/30/2021 12:33:39 PM By: Kalman Shan DO Entered By: Kalman Shan on 12/30/2021 12:29:22 -------------------------------------------------------------------------------- Progress Note Details Patient Name: Date of Service: [name] Haney, [name] 12/30/2021 10:15 A M Medical Record Number: [id number] Patient Account Number: [id number] Date of Birth/Sex: Treating RN: 03/24/1951 (71 y.o. [name] Primary Care Provider: Leonard Downing Other Clinician: Referring Provider: Treating Provider/Extender: [name]: 2 Subjective Chief Complaint Information obtained from Patient Left foot wound History of Present Illness (HPI) Admission 12/16/2021  [name] is a 71 year old male with a past medical history of hypothyroidism and chronic pain syndrome that presents to the clinic for a burn to his left foot one month ago. He states he has electric socks that keep his feet warm and these caused the burn. He denies a history of neuropathy. He started using an ointment recently to the burn site but is not sure of the name. Prior to that he has just been keeping the area covered. The wound has been stable over the past month with little improvement in healing. He currently denies signs of infection. 2/6; patient presents for follow-up. He states he is having trouble keeping the [facility] in place. He denies signs of infection. He reports improvement in wound healing. 2/13; patient presents for follow-up. He has no issues or complaints today. He has found a Band-Aid that has helped keep Hydrofera Blue nicely in place. He denies signs of  infection. He reports improvement in wound healing. Patient History Information obtained from Patient. Family History Heart Disease - Father,Mother, Hypertension - Mother,Father, Stroke - Father, Thyroid Problems - Mother, No family history of Cancer, Diabetes, Hereditary Spherocytosis, Kidney Disease, Lung Disease, Seizures, Tuberculosis. Social History Former smoker - quit 1989, Marital Status - Married, Alcohol Use - Never, Drug Use - Current History - marjuania, Caffeine Use - Daily - coffee. Medical History Eyes Patient has history of Cataracts - starting Denies history of Glaucoma, Optic Neuritis Ear/Nose/Mouth/Throat Denies history of Chronic sinus problems/congestion, Middle ear problems Hematologic/Lymphatic Denies history of Anemia, Hemophilia, Human Immunodeficiency Virus, Lymphedema, Sickle Cell Disease Respiratory Patient has history of Asthma Denies history of Aspiration, Chronic Obstructive Pulmonary Disease (COPD), Pneumothorax, Sleep Apnea, Tuberculosis Cardiovascular Denies history of Angina, Arrhythmia, Congestive Heart Failure, Coronary Artery Disease, Deep Vein Thrombosis, Hypertension, Hypotension, Myocardial Infarction, Peripheral Arterial Disease, Peripheral Venous Disease, Phlebitis, Vasculitis Gastrointestinal Denies history of Cirrhosis , Colitis, Crohnoos, Hepatitis A, Hepatitis B, Hepatitis C Endocrine Denies history of Type I Diabetes, Type II Diabetes Genitourinary Denies history of End Stage Renal Disease Immunological Denies history of Lupus Erythematosus, Raynaudoos, Scleroderma Integumentary (Skin) Denies history of History of Burn Musculoskeletal Patient has history of Rheumatoid Arthritis, Osteoarthritis Denies history of Gout, Osteomyelitis Neurologic Denies history of Dementia, Neuropathy, Quadriplegia, Paraplegia, Seizure Disorder Oncologic Denies history of Received Chemotherapy, Received Radiation Psychiatric Denies history of  Anorexia/bulimia, Confinement Anxiety Medical A Surgical History Notes nd Constitutional Symptoms (General Health) hypothyroidism Objective Constitutional respirations regular, non-labored and within target range for patient.. Vitals Time Taken: 10:11 AM, Height: 67 in, Weight: 208 lbs, BMI: 32.6, Temperature: 98.5 F, Pulse: 68 bpm, Respiratory Rate: 18 breaths/min, Blood Pressure: 123/85 mmHg. Psychiatric pleasant and cooperative. General Notes: Left foot: T the medial calcaneus there is an open wound with nonviable tissue and granulation tissue present. No signs of surrounding o infection. No drainage noted. Integumentary (Hair, Skin) Wound #1 status is Open. Original cause of wound was Thermal Burn. The date acquired was: 11/15/2021. The wound has been in treatment 2 weeks. The wound is located on the Left,Medial Calcaneus. The wound measures 0.9cm length x 0.4cm width x 0.1cm depth; 0.283cm^2 area and 0.028cm^3 volume. There is Fat Layer (Subcutaneous Tissue) exposed. There is no tunneling or undermining noted. There is a medium amount of serosanguineous drainage noted. The wound margin is distinct with the outline attached to the wound base. There is large (67-100%) pink, pale granulation within the wound bed. There is a small (1- 33%) amount of necrotic tissue within the wound bed  including Parc. Assessment Active Problems ICD-10 Non-pressure chronic ulcer of other part of left foot with fat layer exposed Burn of second degree of right foot, initial encounter Hypothyroidism, unspecified Patient's wound has shown improvement in size and appearance since last clinic visit. No signs of infection. I debrided nonviable tissue. I recommended continuing with Hydrofera Blue. Procedures Wound #1 Pre-procedure diagnosis of Wound #1 is a 2nd degree Burn located on the Left,Medial Calcaneus . There was a Excisional Skin/Subcutaneous Tissue Debridement with a total area of 0.36  sq cm performed by Kalman Shan, DO. With the following instrument(s): Curette to remove Non-Viable tissue/material. Material removed includes Subcutaneous Tissue and Slough and after achieving pain control using Other (Benzocaine). No specimens were taken. A time out was conducted at 11:13, prior to the start of the procedure. A Minimum amount of bleeding was controlled with Pressure. The procedure was tolerated well. Post Debridement Measurements: 0.9cm length x 0.4cm width x 0.1cm depth; 0.028cm^3 volume. Character of Wound/Ulcer Post Debridement is stable. Post procedure Diagnosis Wound #1: Same as Pre-Procedure Plan Follow-up Appointments: Return Appointment in 1 week. - Dr. Heber Loyall Other: [employer]- wound care bordered foam dressings. Bathing/ Shower/ Hygiene: May shower and wash wound with soap and water. - with dressing changes. Edema Control - Lymphedema / SCD / Other: Elevate legs to the level of the heart or above for 30 minutes daily and/or when sitting, a frequency of: - 3=4 times a day throughout the day. Avoid standing for long periods of time. Moisturize legs daily. - both legs every night before bed. Off-Loading: Other: - ensure no pressure or rubbing from shoes. WOUND #1: - Calcaneus Wound Laterality: Left, Medial Cleanser: Soap and Water 1 x Per Day/30 Days Discharge Instructions: May shower and wash wound with dial antibacterial soap and water prior to dressing change. Peri-Wound Care: Skin Prep (Generic) 1 x Per Day/30 Days Discharge Instructions: Use skin prep as directed Prim Dressing: Hydrofera Blue Classic Foam, 2x2 in 1 x Per Day/30 Days ary Discharge Instructions: Moisten with saline prior to applying over the santyl. Prim Dressing: Santyl Ointment 1 x Per Day/30 Days ary Discharge Instructions: Apply nickel thick amount to wound bed then apply hydrofera blue Secondary Dressing: Zetuvit Plus Silicone Border Dressing 4x4 (in/in) (Generic) 1 x Per  Day/30 Days Discharge Instructions: Apply silicone border over primary dressing as directed. 1. In office sharp debridement 2. Hydrofera Blue with dressing changes 3. Follow-up in 1 week Electronic Signature(s) Signed: 12/30/2021 12:33:39 PM By: Kalman Shan DO Entered By: Kalman Shan on 12/30/2021 12:32:20 -------------------------------------------------------------------------------- HxROS Details Patient Name: Date of Service: [name] Haney, [name] 12/30/2021 10:15 A M Medical Record Number: [id number] Patient Account Number: [id number] Date of Birth/Sex: Treating RN: [birth year] (71 y.o. [name] Primary Care Provider: Leonard Downing Other Clinician: Referring Provider: Treating Provider/Extender: [name]: 2 Information Obtained From Patient Constitutional Symptoms (General Health) Medical History: Past Medical History Notes: hypothyroidism Eyes Medical History: Positive for: Cataracts - starting Negative for: Glaucoma; Optic Neuritis Ear/Nose/Mouth/Throat Medical History: Negative for: Chronic sinus problems/congestion; Middle ear problems Hematologic/Lymphatic Medical History: Negative for: Anemia; Hemophilia; Human Immunodeficiency Virus; Lymphedema; Sickle Cell Disease Respiratory Medical History: Positive for: Asthma Negative for: Aspiration; Chronic Obstructive Pulmonary Disease (COPD); Pneumothorax; Sleep Apnea; Tuberculosis Cardiovascular Medical History: Negative for: Angina; Arrhythmia; Congestive Heart Failure; Coronary Artery Disease; Deep Vein Thrombosis; Hypertension; Hypotension; Myocardial Infarction; Peripheral Arterial Disease; Peripheral Venous Disease; Phlebitis; Vasculitis Gastrointestinal Medical History: Negative for:  Cirrhosis ; Colitis; Crohns; Hepatitis A; Hepatitis B; Hepatitis C Endocrine Medical History: Negative for: Type I Diabetes; Type II  Diabetes Genitourinary Medical History: Negative for: End Stage Renal Disease Immunological Medical History: Negative for: Lupus Erythematosus; Raynauds; Scleroderma Integumentary (Skin) Medical History: Negative for: History of Burn Musculoskeletal Medical History: Positive for: Rheumatoid Arthritis; Osteoarthritis Negative for: Gout; Osteomyelitis Neurologic Medical History: Negative for: Dementia; Neuropathy; Quadriplegia; Paraplegia; Seizure Disorder Oncologic Medical History: Negative for: Received Chemotherapy; Received Radiation Psychiatric Medical History: Negative for: Anorexia/bulimia; Confinement Anxiety HBO Extended History Items Eyes: Cataracts Immunizations Pneumococcal Vaccine: Received Pneumococcal Vaccination: No Implantable Devices No devices added Family and Social History Cancer: No; Diabetes: No; Heart Disease: Yes - Father,Mother; Hereditary Spherocytosis: No; Hypertension: Yes - Mother,Father; Kidney Disease: No; Lung Disease: No; Seizures: No; Stroke: Yes - Father; Thyroid Problems: Yes - Mother; Tuberculosis: No; Former smoker - quit 1989; Marital Status - Married; Alcohol Use: Never; Drug Use: Current History - marjuania; Caffeine Use: Daily - coffee; Financial Concerns: No; Food, Clothing or Shelter Needs: No; Support System Lacking: No; Transportation Concerns: No Electronic Signature(s) Signed: 12/30/2021 12:33:39 PM By: Kalman Shan DO Signed: 12/30/2021 5:31:57 PM By: Lorrin Jackson Entered By: Kalman Shan on 12/30/2021 12:30:15 -------------------------------------------------------------------------------- SuperBill Details Patient Name: Date of Service: [name] Haney, [name] 12/30/2021 Medical Record Number: [id number] Patient Account Number: [id number] Date of Birth/Sex: Treating RN: 1951-02-01 (71 y.o. [name] Primary Care Provider: Leonard Downing Other Clinician: Referring Provider: Treating Provider/Extender:  Dellia Nims Weeks in Treatment: 2 Diagnosis Coding ICD-10 Codes Code Description [phone] Non-pressure chronic ulcer of other part of left foot with fat layer exposed T25.221A Burn of second degree of right foot, initial encounter E03.9 Hypothyroidism, unspecified Facility Procedures CPT4 Code: [id number] Description: [facility] - DEB SUBQ TISSUE 20 SQ CM/< ICD-10 Diagnosis Description L97.522 Non-pressure chronic ulcer of other part of left foot with fat layer exposed T25.221A Burn of second degree of right foot, initial encounter Modifier: Quantity: 1 Physician Procedures : CPT4 Code Description Modifier [id number] - WC PHYS SUBQ TISS 20 SQ CM ICD-10 Diagnosis Description L97.522 Non-pressure chronic ulcer of other part of left foot with fat layer exposed T25.221A Burn of second degree of right foot, initial encounter Quantity: 1 Electronic Signature(s) Signed: 12/30/2021 12:33:39 PM By: Kalman Shan DO Entered By: Kalman Shan on 12/30/2021 12:32:32

## 2021-12-30 NOTE — Progress Notes (Signed)
[name] Haney, [name] Haney ([id number]) Visit Report for 12/30/2021 Arrival Information Details Patient Name: Date of Service: [facility], [name] 12/30/2021 10:15 A M Medical Record Number: [id number] Patient Account Number: [id number] Date of Birth/Sex: Treating RN: 1951/06/11 (71 y.o. [name] Primary Care Conner Neiss: Leonard Downing Other Clinician: Referring Andrianna Manalang: Treating Haruto Demaria/Extender: [name]: 2 Visit Information History Since Last Visit Added or deleted any medications: No Patient Arrived: Ambulatory Any new allergies or adverse reactions: No Arrival Time: 10:07 Had a fall or experienced change in No Transfer Assistance: None activities of daily living that may affect Patient Identification Verified: Yes risk of falls: Secondary Verification Process Completed: Yes Signs or symptoms of abuse/neglect since last visito No Patient Requires Transmission-Based Precautions: No Hospitalized since last visit: No Patient Has Alerts: No Implantable device outside of the clinic excluding No cellular tissue based products placed in the center since last visit: Has Dressing in Place as Prescribed: Yes Pain Present Now: No Electronic Signature(s) Signed: 12/30/2021 5:31:57 PM By: Lorrin Jackson Entered By: Lorrin Jackson on 12/30/2021 10:09:45 -------------------------------------------------------------------------------- Encounter Discharge Information Details Patient Name: Date of Service: [place], [name] 12/30/2021 10:15 A M Medical Record Number: [id number] Patient Account Number: [id number] Date of Birth/Sex: Treating RN: [birth year] (71 y.o. [name] Primary Care Syrita Dovel: Leonard Downing Other Clinician: Referring Julliana Whitmyer: Treating Shary Lamos/Extender: [name]: 2 Encounter Discharge Information Items Post Procedure Vitals Discharge Condition:  Stable Temperature (F): 98.5 Ambulatory Status: Ambulatory Pulse (bpm): 68 Discharge Destination: Home Respiratory Rate (breaths/min): 18 Transportation: Private Auto Blood Pressure (mmHg): 123/85 Schedule Follow-up Appointment: Yes Clinical Summary of Care: Provided on 12/30/2021 Form Type Recipient Paper Patient Patient Electronic Signature(s) Signed: 12/30/2021 12:33:17 PM By: Lorrin Jackson Entered By: Lorrin Jackson on 12/30/2021 12:33:17 -------------------------------------------------------------------------------- Lower Extremity Assessment Details Patient Name: Date of Service: [facility], [name] 12/30/2021 10:15 A M Medical Record Number: [id number] Patient Account Number: [id number] Date of Birth/Sex: Treating RN: 1951/08/27 (71 y.o. [name] Primary Care Yaffa Seckman: Leonard Downing Other Clinician: Referring Nichola Warren: Treating Cristie Mckinney/Extender: Dellia Nims Weeks in Treatment: 2 Edema Assessment Assessed: Shirlyn Goltz: Yes] Patrice Paradise: No] Edema: [Left: N] [Right: o] Calf Left: Right: Point of Measurement: 28 cm From Medial Instep 37 cm Ankle Left: Right: Point of Measurement: 9 cm From Medial Instep 23 cm Vascular Assessment Pulses: Dorsalis Pedis Palpable: [Left:Yes] Electronic Signature(s) Signed: 12/30/2021 5:31:57 PM By: Lorrin Jackson Entered By: Lorrin Jackson on 12/30/2021 10:16:30 -------------------------------------------------------------------------------- Multi Wound Chart Details Patient Name: Date of Service: [facility], [name] 12/30/2021 10:15 A M Medical Record Number: [id number] Patient Account Number: [id number] Date of Birth/Sex: Treating RN: 1951-01-25 (71 y.o. [name] Primary Care Yonna Alwin: Leonard Downing Other Clinician: Referring Dashea Mcmullan: Treating Dequandre Cordova/Extender: Dellia Nims Weeks in Treatment: 2 Vital Signs Height(in): 67 Pulse(bpm): 36 Weight(lbs):  208 Blood Pressure(mmHg): 123/85 Body Mass Index(BMI): 32.6 Temperature(F): 98.5 Respiratory Rate(breaths/min): 18 Photos: [1:Left, Medial Calcaneus] [N/A:N/A N/A] Wound Location: [1:Thermal Burn] [N/A:N/A] Wounding Event: [1:2nd degree Burn] [N/A:N/A] Primary Etiology: [1:Cataracts, Asthma, Rheumatoid] [N/A:N/A] Comorbid History: [1:Arthritis, Osteoarthritis 11/15/2021] [N/A:N/A] Date Acquired: [1:2] [N/A:N/A] Weeks of Treatment: [1:Open] [N/A:N/A] Wound Status: [1:No] [N/A:N/A] Wound Recurrence: [1:0.9x0.4x0.1] [N/A:N/A] Measurements L x W x D (cm) [1:0.283] [N/A:N/A] A (cm) : rea [1:0.028] [N/A:N/A] Volume (cm) : [1:77.50%] [N/A:N/A] % Reduction in A [1:rea: 77.80%] [N/A:N/A] % Reduction in Volume: [1:Full Thickness With Exposed Support N/A] Classification: [1:Structures Medium] [N/A:N/A] Exudate A  mount: [1:Serosanguineous] [N/A:N/A] Exudate Type: [1:red, brown] [N/A:N/A] Exudate Color: [1:Distinct, outline attached] [N/A:N/A] Wound Margin: [1:Large (67-100%)] [N/A:N/A] Granulation A mount: [1:Pink, Pale] [N/A:N/A] Granulation Quality: [1:Small (1-33%)] [N/A:N/A] Necrotic A mount: [1:Fat Layer (Subcutaneous Tissue): Yes N/A] Exposed Structures: [1:Fascia: No Tendon: No Muscle: No Joint: No Bone: No Medium (34-66%)] [N/A:N/A] Epithelialization: [1:Debridement - Excisional] [N/A:N/A] Debridement: Pre-procedure Verification/Time Out 11:13 [N/A:N/A] Taken: [1:Other] [N/A:N/A] Pain Control: [1:Subcutaneous, Slough] [N/A:N/A] Tissue Debrided: [1:Skin/Subcutaneous Tissue] [N/A:N/A] Level: [1:0.36] [N/A:N/A] Debridement A (sq cm): [1:rea Curette] [N/A:N/A] Instrument: [1:Minimum] [N/A:N/A] Bleeding: [1:Pressure] [N/A:N/A] Hemostasis A chieved: [1:Procedure was tolerated well] [N/A:N/A] Debridement Treatment Response: [1:0.9x0.4x0.1] [N/A:N/A] Post Debridement Measurements L x W x D (cm) [1:0.028] [N/A:N/A] Post Debridement Volume: (cm) [1:Debridement]  [N/A:N/A] Treatment Notes Electronic Signature(s) Signed: 12/30/2021 12:33:39 PM By: Kalman Shan DO Signed: 12/30/2021 5:31:57 PM By: Lorrin Jackson Entered By: Kalman Shan on 12/30/2021 12:29:30 -------------------------------------------------------------------------------- Multi-Disciplinary Care Plan Details Patient Name: Date of Service: [facility], [name] 12/30/2021 10:15 A M Medical Record Number: [id number] Patient Account Number: [id number] Date of Birth/Sex: Treating RN: 08-Mar-1951 (71 y.o. [name] Primary Care Meeka Cartelli: Leonard Downing Other Clinician: Referring Khali Albanese: Treating Jaimee Corum/Extender: [name]: 2 Active Inactive Orientation to the Wound Care Program Nursing Diagnoses: Knowledge deficit related to the wound healing center program Goals: Patient/caregiver will verbalize understanding of the [place] Date Initiated: 12/16/2021 Target Resolution Date: 01/13/2022 Goal Status: Active Interventions: Provide education on orientation to the wound center Notes: Pain, Acute or Chronic Nursing Diagnoses: Pain, acute or chronic: actual or potential Potential alteration in comfort, pain Goals: Patient will verbalize adequate pain control and receive pain control interventions during procedures as needed Date Initiated: 12/16/2021 Target Resolution Date: 01/13/2022 Goal Status: Active Patient/caregiver will verbalize comfort level met Date Initiated: 12/16/2021 Target Resolution Date: 01/13/2022 Goal Status: Active Interventions: Encourage patient to take pain medications as prescribed Provide education on pain management Treatment Activities: Administer pain control measures as ordered : 12/16/2021 Notes: Wound/Skin Impairment Nursing Diagnoses: Knowledge deficit related to ulceration/compromised skin integrity Goals: Patient/caregiver will verbalize understanding of  skin care regimen Date Initiated: 12/16/2021 Target Resolution Date: 01/13/2022 Goal Status: Active Interventions: Assess patient/caregiver ability to obtain necessary supplies Assess patient/caregiver ability to perform ulcer/skin care regimen upon admission and as needed Provide education on ulcer and skin care Treatment Activities: Skin care regimen initiated : 12/16/2021 Topical wound management initiated : 12/16/2021 Notes: Electronic Signature(s) Signed: 12/30/2021 5:31:57 PM By: Lorrin Jackson Entered By: Lorrin Jackson on 12/30/2021 10:07:22 -------------------------------------------------------------------------------- Pain Assessment Details Patient Name: Date of Service: [name], [name] 12/30/2021 10:15 A M Medical Record Number: [id number] Patient Account Number: [id number] Date of Birth/Sex: Treating RN: Sep 14, 1951 (71 y.o. [name] Primary Care Adilene Areola: Leonard Downing Other Clinician: Referring Cedarius Kersh: Treating Pearly Bartosik/Extender: Dellia Nims Weeks in Treatment: 2 Active Problems Location of Pain Severity and Description of Pain Patient Has Paino No Site Locations Pain Management and Medication Current Pain Management: Electronic Signature(s) Signed: 12/30/2021 5:31:57 PM By: Lorrin Jackson Entered By: Lorrin Jackson on 12/30/2021 10:13:27 -------------------------------------------------------------------------------- Patient/Caregiver Education Details Patient Name: Date of Service: [name], [name] 2/13/2023andnbsp10:15 A M Medical Record Number: [id number] Patient Account Number: [id number] Date of Birth/Gender: Treating RN: 02/28/1951 (72 y.o. [name] Primary Care Physician: Leonard Downing Other Clinician: Referring Physician: Treating Physician/Extender: [name]: 2 Education Assessment Education Provided To: Patient Education Topics  Provided Offloading: Methods: Explain/Verbal Responses:  State content correctly Wound/Skin Impairment: Methods: Demonstration, Explain/Verbal, Printed Responses: State content correctly Electronic Signature(s) Signed: 12/30/2021 5:31:57 PM By: Lorrin Jackson Entered By: Lorrin Jackson on 12/30/2021 10:07:53 -------------------------------------------------------------------------------- Wound Assessment Details Patient Name: Date of Service: [name], [name] 12/30/2021 10:15 A M Medical Record Number: [id number] Patient Account Number: [id number] Date of Birth/Sex: Treating RN: 08/30/1951 (71 y.o. [name] Primary Care Marely Apgar: Leonard Downing Other Clinician: Referring Mariely Mahr: Treating Astha Probasco/Extender: Dellia Nims Weeks in Treatment: 2 Wound Status Wound Number: 1 Primary Etiology: 2nd degree Burn Wound Location: Left, Medial Calcaneus Wound Status: Open Wounding Event: Thermal Burn Comorbid History: Cataracts, Asthma, Rheumatoid Arthritis, Osteoarthritis Date Acquired: 11/15/2021 Weeks Of Treatment: 2 Clustered Wound: No Photos Wound Measurements Length: (cm) 0.9 Width: (cm) 0.4 Depth: (cm) 0.1 Area: (cm) 0.283 Volume: (cm) 0.028 % Reduction in Area: 77.5% % Reduction in Volume: 77.8% Epithelialization: Medium (34-66%) Tunneling: No Undermining: No Wound Description Classification: Full Thickness With Exposed Support Structures Wound Margin: Distinct, outline attached Exudate Amount: Medium Exudate Type: Serosanguineous Exudate Color: red, brown Foul Odor After Cleansing: No Slough/Fibrino Yes Wound Bed Granulation Amount: Large (67-100%) Exposed Structure Granulation Quality: Pink, Pale Fascia Exposed: No Necrotic Amount: Small (1-33%) Fat Layer (Subcutaneous Tissue) Exposed: Yes Necrotic Quality: Adherent Slough Tendon Exposed: No Muscle Exposed: No Joint Exposed: No Bone Exposed: No Treatment  Notes Wound #1 (Calcaneus) Wound Laterality: Left, Medial Cleanser Soap and Water Discharge Instruction: May shower and wash wound with dial antibacterial soap and water prior to dressing change. Peri-Wound Care Skin Prep Discharge Instruction: Use skin prep as directed Topical Primary Dressing Hydrofera Blue Classic Foam, 2x2 in Discharge Instruction: Moisten with saline prior to applying over the santyl. Santyl Ointment Discharge Instruction: Apply nickel thick amount to wound bed then apply hydrofera blue Secondary Dressing Zetuvit Plus Silicone Border Dressing 4x4 (in/in) Discharge Instruction: Apply silicone border over primary dressing as directed. Secured With Compression Wrap Compression Stockings Environmental education officer) Signed: 12/30/2021 5:31:57 PM By: Lorrin Jackson Entered By: Lorrin Jackson on 12/30/2021 10:16:05 -------------------------------------------------------------------------------- Vitals Details Patient Name: Date of Service: [name], [name] 12/30/2021 10:15 A M Medical Record Number: [id number] Patient Account Number: [id number] Date of Birth/Sex: Treating RN: [birth year] (71 y.o. [name] Primary Care Yulonda Wheeling: Leonard Downing Other Clinician: Referring Nakira Litzau: Treating Bennie Chirico/Extender: Dellia Nims Weeks in Treatment: 2 Vital Signs Time Taken: 10:11 Temperature (F): 98.5 Height (in): 67 Pulse (bpm): 68 Weight (lbs): 208 Respiratory Rate (breaths/min): 18 Body Mass Index (BMI): 32.6 Blood Pressure (mmHg): 123/85 Reference Range: 80 - 120 mg / dl Electronic Signature(s) Signed: 12/30/2021 5:31:57 PM By: Lorrin Jackson Entered By: Lorrin Jackson on 12/30/2021 10:11:28

## 2022-01-03 LAB — CMP14+EGFR
ALT: 6 IU/L (ref 0–44)
AST: 11 IU/L (ref 0–40)
Albumin/Globulin Ratio: 1.7 (ref 1.2–2.2)
Albumin: 4.5 g/dL (ref 3.7–4.7)
Alkaline Phosphatase: 98 IU/L (ref 44–121)
BUN/Creatinine Ratio: 12 (ref 10–24)
BUN: 15 mg/dL (ref 8–27)
Bilirubin Total: 0.4 mg/dL (ref 0.0–1.2)
CO2: 23 mmol/L (ref 20–29)
Calcium: 9.5 mg/dL (ref 8.6–10.2)
Chloride: 105 mmol/L (ref 96–106)
Creatinine, Ser: 1.25 mg/dL (ref 0.76–1.27)
Globulin, Total: 2.7 g/dL (ref 1.5–4.5)
Glucose: 97 mg/dL (ref 70–99)
Potassium: 4.7 mmol/L (ref 3.5–5.2)
Sodium: 144 mmol/L (ref 134–144)
Total Protein: 7.2 g/dL (ref 6.0–8.5)
eGFR: 62 mL/min/{1.73_m2} (ref >59)

## 2022-01-03 LAB — APO A1 + B + RATIO
Apolipo. B/A-1 Ratio: 1.6 ratio — ABNORMAL HIGH (ref 0.0–0.7)
Apolipoprotein A-1: 110 mg/dL (ref 101–178)
Apolipoprotein B: 179 mg/dL — ABNORMAL HIGH (ref <90)

## 2022-01-03 LAB — LIPID PANEL WITH LDL/HDL RATIO
Cholesterol, Total: 315 mg/dL — ABNORMAL HIGH (ref 100–199)
HDL: 38 mg/dL — ABNORMAL LOW (ref >39)
LDL Chol Calc (NIH): 247 mg/dL — ABNORMAL HIGH (ref 0–99)
LDL/HDL Ratio: 6.5 ratio — ABNORMAL HIGH (ref 0.0–3.6)
Triglycerides: 154 mg/dL — ABNORMAL HIGH (ref 0–149)
VLDL Cholesterol Cal: 30 mg/dL (ref 5–40)

## 2022-01-03 LAB — LDL CHOLESTEROL, DIRECT: LDL Direct: 227 mg/dL — ABNORMAL HIGH (ref 0–99)

## 2022-01-03 LAB — LIPOPROTEIN A (LPA): Lipoprotein (a): 30.1 nmol/L (ref <75.0)

## 2022-01-07 ENCOUNTER — Encounter (HOSPITAL_BASED_OUTPATIENT_CLINIC_OR_DEPARTMENT_OTHER): Payer: Medicare Other | Admitting: Internal Medicine

## 2022-01-07 ENCOUNTER — Other Ambulatory Visit: Payer: Self-pay

## 2022-01-07 DIAGNOSIS — L97522 Non-pressure chronic ulcer of other part of left foot with fat layer exposed: Secondary | ICD-10-CM

## 2022-01-07 NOTE — Progress Notes (Signed)
ARIO, MCDIARMID ([id number]) Visit Report for 01/07/2022 Chief Complaint Document Details Patient Name: Date of Service: [facility], [name] 01/07/2022 10:30 A M Medical Record Number: [id number] Patient Account Number: [id number] Date of Birth/Sex: Treating RN: Feb 05, 1951 (71 y.o. M) Primary Care Provider: Leonard Haney Other Clinician: Referring Provider: Treating Provider/Extender: [name] Haney in Treatment: 3 Information Obtained from: Patient Chief Complaint Left foot wound Electronic Signature(s) Signed: 01/07/2022 12:29:04 PM By: [name] Shan DO Entered By: [name] Haney on 01/07/2022 12:24:11 -------------------------------------------------------------------------------- Debridement Details Patient Name: Date of Service: [name], [name] 01/07/2022 10:30 A M Medical Record Number: [id number] Patient Account Number: [id number] Date of Birth/Sex: Treating RN: 1950-12-20 (71 y.o. [name] Haney, [name] Haney Primary Care Provider: Leonard Haney Other Clinician: Referring Provider: Treating Provider/Extender: [name] Haney in Treatment: 3 Debridement Performed for Assessment: Wound #1 Left,Medial Calcaneus Performed By: Physician [name] Shan, DO Debridement Type: Debridement Level of Consciousness (Pre-procedure): Awake and Alert Pre-procedure Verification/Time Out Yes - 11:30 Taken: Start Time: 11:31 Pain Control: Other : benzocaine 20% T Area Debrided (L x W): otal 0.7 (cm) x 0.2 (cm) = 0.14 (cm) Tissue and other material debrided: Viable, Non-Viable, Slough, Subcutaneous, Slough Level: Skin/Subcutaneous Tissue Debridement Description: Excisional Instrument: Curette Bleeding: Minimum Hemostasis Achieved: Pressure End Time: 11:33 Procedural Pain: 0 Post Procedural Pain: 0 Response to Treatment: Procedure was tolerated well Level of Consciousness (Post- Awake and Alert procedure): Post  Debridement Measurements of Total Wound Length: (cm) 0.7 Width: (cm) 0.2 Depth: (cm) 0.1 Volume: (cm) 0.011 Character of Wound/Ulcer Post Debridement: Improved Post Procedure Diagnosis Same as Pre-procedure Electronic Signature(s) Signed: 01/07/2022 12:29:04 PM By: [name] Shan DO Signed: 01/07/2022 4:50:54 PM By: [name] Pilling RN, BSN Entered By: [name] Haney on 01/07/2022 11:34:50 -------------------------------------------------------------------------------- HPI Details Patient Name: Date of Service: [name], [name] 01/07/2022 10:30 A M Medical Record Number: [id number] Patient Account Number: [id number] Date of Birth/Sex: Treating RN: 09/13/1951 (71 y.o. M) Primary Care Provider: Leonard Haney Other Clinician: Referring Provider: Treating Provider/Extender: [name] Haney in Treatment: 3 History of Present Illness HPI Description: Admission 12/16/2021 [name] is a 71 year old male with a past medical history of hypothyroidism and chronic pain syndrome that presents to the clinic for a burn to his left foot one month ago. He states he has electric socks that keep his feet warm and these caused the burn. He denies a history of neuropathy. He started using an ointment recently to the burn site but is not sure of the name. Prior to that he has just been keeping the area covered. The wound has been stable over the past month with little improvement in healing. He currently denies signs of infection. 2/6; patient presents for follow-up. He states he is having trouble keeping the [facility] in place. He denies signs of infection. He reports improvement in wound healing. 2/13; patient presents for follow-up. He has no issues or complaints today. He has found a Band-Aid that has helped keep Hydrofera Blue nicely in place. He denies signs of infection. He reports improvement in wound healing. 2/21; patient presents for follow-up. He  has no issues or complaints today. He has been using Hydrofera Blue to the wound bed without issues. He continues to report improvement in wound healing. Electronic Signature(s) Signed: 01/07/2022 12:29:04 PM By: [name] Shan DO Entered By: [name] Haney on 01/07/2022 12:24:33 -------------------------------------------------------------------------------- Physical Exam Details Patient Name: Date of Service: [name], [name] 01/07/2022 10:30  A M Medical Record Number: [id number] Patient Account Number: [id number] Date of Birth/Sex: Treating RN: 1951-08-23 (71 y.o. M) Primary Care Provider: Leonard Haney Other Clinician: Referring Provider: Treating Provider/Extender: [name] Haney in Treatment: 3 Constitutional respirations regular, non-labored and within target range for patient.. Cardiovascular 2+ dorsalis pedis/posterior tibialis pulses. Psychiatric pleasant and cooperative. Notes Left foot: T the medial calcaneus there is an open wound with nonviable tissue and granulation tissue present. No signs of surrounding infection. No drainage o noted. Electronic Signature(s) Signed: 01/07/2022 12:29:04 PM By: [name] Shan DO Entered By: [name] Haney on 01/07/2022 12:25:09 -------------------------------------------------------------------------------- Physician Orders Details Patient Name: Date of Service: [name], [name] 01/07/2022 10:30 A M Medical Record Number: [id number] Patient Account Number: [id number] Date of Birth/Sex: Treating RN: 23-Oct-1951 (71 y.o. [name] Primary Care Provider: Leonard Haney Other Clinician: Referring Provider: Treating Provider/Extender: [name] Haney in Treatment: 3 Verbal / Phone Orders: No Diagnosis Coding ICD-10 Coding Code Description [phone] Non-pressure chronic ulcer of other part of left foot with fat layer exposed T25.221A Burn of  second degree of right foot, initial encounter E03.9 Hypothyroidism, unspecified Follow-up Appointments ppointment in 2 Haney. - Dr. Heber [place] and Tammi Klippel, RN Room 8 Tuesday 01/21/2022. Return A Other: - [employer]- wound care bordered foam dressings. Bathing/ Shower/ Hygiene May shower and wash wound with soap and water. - with dressing changes. Edema Control - Lymphedema / SCD / Other Elevate legs to the level of the heart or above for 30 minutes daily and/or when sitting, a frequency of: - 3=4 times a day throughout the day. Avoid standing for long periods of time. Moisturize legs daily. - both legs every night before bed. Off-Loading Other: - ensure no pressure or rubbing from shoes. Wound Treatment Wound #1 - Calcaneus Wound Laterality: Left, Medial Cleanser: Soap and Water 1 x Per Day/30 Days Discharge Instructions: May shower and wash wound with dial antibacterial soap and water prior to dressing change. Peri-Wound Care: Skin Prep (Generic) 1 x Per Day/30 Days Discharge Instructions: Use skin prep as directed Prim Dressing: Hydrofera Blue Classic Foam, 2x2 in 1 x Per Day/30 Days ary Discharge Instructions: Moisten with saline prior to applying over the santyl. Secondary Dressing: Zetuvit Plus Silicone Border Dressing 4x4 (in/in) (Generic) 1 x Per Day/30 Days Discharge Instructions: Apply silicone border over primary dressing as directed. Electronic Signature(s) Signed: 01/07/2022 12:29:04 PM By: [name] Shan DO Entered By: [name] Haney on 01/07/2022 12:27:56 -------------------------------------------------------------------------------- Problem List Details Patient Name: Date of Service: [name], [name] 01/07/2022 10:30 A M Medical Record Number: [id number] Patient Account Number: [id number] Date of Birth/Sex: Treating RN: [birth year] (71 y.o. [name] Haney, Tammi Klippel Primary Care Provider: Leonard Haney Other Clinician: Referring Provider: Treating  Provider/Extender: [name] Haney in Treatment: 3 Active Problems ICD-10 Encounter Code Description Active Date MDM Diagnosis [phone] Non-pressure chronic ulcer of other part of left foot with fat layer exposed 12/16/2021 No Yes T25.221A Burn of second degree of right foot, initial encounter 12/16/2021 No Yes E03.9 Hypothyroidism, unspecified 12/16/2021 No Yes Inactive Problems Resolved Problems Electronic Signature(s) Signed: 01/07/2022 12:29:04 PM By: [name] Shan DO Entered By: [name] Haney on 01/07/2022 12:23:51 -------------------------------------------------------------------------------- Progress Note Details Patient Name: Date of Service: [name], [name] 01/07/2022 10:30 A M Medical Record Number: [id number] Patient Account Number: [id number] Date of Birth/Sex: Treating RN: 12/26/1950 (71 y.o. M) Primary Care Provider: Leonard Haney Other Clinician: Referring Provider: Treating Provider/Extender: Heber Dent  Ignatius Specking, Curt Jews Haney in Treatment: 3 Subjective Chief Complaint Information obtained from Patient Left foot wound History of Present Illness (HPI) Admission 12/16/2021 Mr. Ly Bacchi is a 71 year old male with a past medical history of hypothyroidism and chronic pain syndrome that presents to the clinic for a burn to his left foot one month ago. He states he has electric socks that keep his feet warm and these caused the burn. He denies a history of neuropathy. He started using an ointment recently to the burn site but is not sure of the name. Prior to that he has just been keeping the area covered. The wound has been stable over the past month with little improvement in healing. He currently denies signs of infection. 2/6; patient presents for follow-up. He states he is having trouble keeping the [facility] in place. He denies signs of infection. He reports improvement in wound healing. 2/13; patient  presents for follow-up. He has no issues or complaints today. He has found a Band-Aid that has helped keep Hydrofera Blue nicely in place. He denies signs of infection. He reports improvement in wound healing. 2/21; patient presents for follow-up. He has no issues or complaints today. He has been using Hydrofera Blue to the wound bed without issues. He continues to report improvement in wound healing. Patient History Information obtained from Patient. Family History Heart Disease - Father,Mother, Hypertension - Mother,Father, Stroke - Father, Thyroid Problems - Mother, No family history of Cancer, Diabetes, Hereditary Spherocytosis, Kidney Disease, Lung Disease, Seizures, Tuberculosis. Social History Former smoker - quit 1989, Marital Status - Married, Alcohol Use - Never, Drug Use - Current History - marjuania, Caffeine Use - Daily - coffee. Medical History Eyes Patient has history of Cataracts - starting Denies history of Glaucoma, Optic Neuritis Ear/Nose/Mouth/Throat Denies history of Chronic sinus problems/congestion, Middle ear problems Hematologic/Lymphatic Denies history of Anemia, Hemophilia, Human Immunodeficiency Virus, Lymphedema, Sickle Cell Disease Respiratory Patient has history of Asthma Denies history of Aspiration, Chronic Obstructive Pulmonary Disease (COPD), Pneumothorax, Sleep Apnea, Tuberculosis Cardiovascular Denies history of Angina, Arrhythmia, Congestive Heart Failure, Coronary Artery Disease, Deep Vein Thrombosis, Hypertension, Hypotension, Myocardial Infarction, Peripheral Arterial Disease, Peripheral Venous Disease, Phlebitis, Vasculitis Gastrointestinal Denies history of Cirrhosis , Colitis, Crohnoos, Hepatitis A, Hepatitis B, Hepatitis C Endocrine Denies history of Type I Diabetes, Type II Diabetes Genitourinary Denies history of End Stage Renal Disease Immunological Denies history of Lupus Erythematosus, Raynaudoos, Scleroderma Integumentary  (Skin) Denies history of History of Burn Musculoskeletal Patient has history of Rheumatoid Arthritis, Osteoarthritis Denies history of Gout, Osteomyelitis Neurologic Denies history of Dementia, Neuropathy, Quadriplegia, Paraplegia, Seizure Disorder Oncologic Denies history of Received Chemotherapy, Received Radiation Psychiatric Denies history of Anorexia/bulimia, Confinement Anxiety Medical A Surgical History Notes nd Constitutional Symptoms (General Health) hypothyroidism Objective Constitutional respirations regular, non-labored and within target range for patient.. Vitals Time Taken: 9:10 AM, Height: 67 in, Weight: 208 lbs, BMI: 32.6, Temperature: 98.5 F, Pulse: 65 bpm, Respiratory Rate: 20 breaths/min, Blood Pressure: 131/84 mmHg. Cardiovascular 2+ dorsalis pedis/posterior tibialis pulses. Psychiatric pleasant and cooperative. General Notes: Left foot: T the medial calcaneus there is an open wound with nonviable tissue and granulation tissue present. No signs of surrounding o infection. No drainage noted. Integumentary (Hair, Skin) Wound #1 status is Open. Original cause of wound was Thermal Burn. The date acquired was: 11/15/2021. The wound has been in treatment 3 Haney. The wound is located on the Left,Medial Calcaneus. The wound measures 0.7cm length x 0.2cm width x 0.1cm depth; 0.11cm^2 area and  0.011cm^3 volume. There is Fat Layer (Subcutaneous Tissue) exposed. There is no tunneling or undermining noted. There is a medium amount of serosanguineous drainage noted. The wound margin is distinct with the outline attached to the wound base. There is large (67-100%) pink, pale granulation within the wound bed. There is a small (1- 33%) amount of necrotic tissue within the wound bed including Adherent Slough. Assessment Active Problems ICD-10 Non-pressure chronic ulcer of other part of left foot with fat layer exposed Burn of second degree of right foot, initial  encounter Hypothyroidism, unspecified Patient's wound has shown improvement in size and appearance since last clinic visit. I debrided nonviable tissue. No signs of surrounding infection. I recommended continuing Hydrofera Blue with dressing changes. Follow-up in 2 Haney. Procedures Wound #1 Pre-procedure diagnosis of Wound #1 is a 2nd degree Burn located on the Left,Medial Calcaneus . There was a Excisional Skin/Subcutaneous Tissue Debridement with a total area of 0.14 sq cm performed by [name] Shan, DO. With the following instrument(s): Curette to remove Viable and Non-Viable tissue/material. Material removed includes Subcutaneous Tissue and Slough and after achieving pain control using Other (benzocaine 20%). A time out was conducted at 11:30, prior to the start of the procedure. A Minimum amount of bleeding was controlled with Pressure. The procedure was tolerated well with a pain level of 0 throughout and a pain level of 0 following the procedure. Post Debridement Measurements: 0.7cm length x 0.2cm width x 0.1cm depth; 0.011cm^3 volume. Character of Wound/Ulcer Post Debridement is improved. Post procedure Diagnosis Wound #1: Same as Pre-Procedure Plan Follow-up Appointments: Return Appointment in 2 Haney. - Dr. Heber [place] and Tammi Klippel, RN Room 8 Tuesday 01/21/2022. Other: - [employer]- wound care bordered foam dressings. Bathing/ Shower/ Hygiene: May shower and wash wound with soap and water. - with dressing changes. Edema Control - Lymphedema / SCD / Other: Elevate legs to the level of the heart or above for 30 minutes daily and/or when sitting, a frequency of: - 3=4 times a day throughout the day. Avoid standing for long periods of time. Moisturize legs daily. - both legs every night before bed. Off-Loading: Other: - ensure no pressure or rubbing from shoes. WOUND #1: - Calcaneus Wound Laterality: Left, Medial Cleanser: Soap and Water 1 x Per Day/30 Days Discharge Instructions:  May shower and wash wound with dial antibacterial soap and water prior to dressing change. Peri-Wound Care: Skin Prep (Generic) 1 x Per Day/30 Days Discharge Instructions: Use skin prep as directed Prim Dressing: Hydrofera Blue Classic Foam, 2x2 in 1 x Per Day/30 Days ary Discharge Instructions: Moisten with saline prior to applying over the santyl. Secondary Dressing: Zetuvit Plus Silicone Border Dressing 4x4 (in/in) (Generic) 1 x Per Day/30 Days Discharge Instructions: Apply silicone border over primary dressing as directed. 1. In office sharp debridement 2. Hydrofera Blue Electronic Signature(s) Signed: 01/07/2022 12:29:04 PM By: [name] Shan DO Entered By: [name] Haney on 01/07/2022 12:28:24 -------------------------------------------------------------------------------- HxROS Details Patient Name: Date of Service: [name], [name] 01/07/2022 10:30 A M Medical Record Number: [id number] Patient Account Number: [id number] Date of Birth/Sex: Treating RN: 01-24-1951 (71 y.o. M) Primary Care Provider: Leonard Haney Other Clinician: Referring Provider: Treating Provider/Extender: [name] Haney in Treatment: 3 Information Obtained From Patient Constitutional Symptoms (General Health) Medical History: Past Medical History Notes: hypothyroidism Eyes Medical History: Positive for: Cataracts - starting Negative for: Glaucoma; Optic Neuritis Ear/Nose/Mouth/Throat Medical History: Negative for: Chronic sinus problems/congestion; Middle ear problems Hematologic/Lymphatic Medical History: Negative for: Anemia;  Hemophilia; Human Immunodeficiency Virus; Lymphedema; Sickle Cell Disease Respiratory Medical History: Positive for: Asthma Negative for: Aspiration; Chronic Obstructive Pulmonary Disease (COPD); Pneumothorax; Sleep Apnea; Tuberculosis Cardiovascular Medical History: Negative for: Angina; Arrhythmia; Congestive Heart Failure;  Coronary Artery Disease; Deep Vein Thrombosis; Hypertension; Hypotension; Myocardial Infarction; Peripheral Arterial Disease; Peripheral Venous Disease; Phlebitis; Vasculitis Gastrointestinal Medical History: Negative for: Cirrhosis ; Colitis; Crohns; Hepatitis A; Hepatitis B; Hepatitis C Endocrine Medical History: Negative for: Type I Diabetes; Type II Diabetes Genitourinary Medical History: Negative for: End Stage Renal Disease Immunological Medical History: Negative for: Lupus Erythematosus; Raynauds; Scleroderma Integumentary (Skin) Medical History: Negative for: History of Burn Musculoskeletal Medical History: Positive for: Rheumatoid Arthritis; Osteoarthritis Negative for: Gout; Osteomyelitis Neurologic Medical History: Negative for: Dementia; Neuropathy; Quadriplegia; Paraplegia; Seizure Disorder Oncologic Medical History: Negative for: Received Chemotherapy; Received Radiation Psychiatric Medical History: Negative for: Anorexia/bulimia; Confinement Anxiety HBO Extended History Items Eyes: Cataracts Immunizations Pneumococcal Vaccine: Received Pneumococcal Vaccination: No Implantable Devices No devices added Family and Social History Cancer: No; Diabetes: No; Heart Disease: Yes - Father,Mother; Hereditary Spherocytosis: No; Hypertension: Yes - Mother,Father; Kidney Disease: No; Lung Disease: No; Seizures: No; Stroke: Yes - Father; Thyroid Problems: Yes - Mother; Tuberculosis: No; Former smoker - quit 1989; Marital Status - Married; Alcohol Use: Never; Drug Use: Current History - marjuania; Caffeine Use: Daily - coffee; Financial Concerns: No; Food, Clothing or Shelter Needs: No; Support System Lacking: No; Transportation Concerns: No Electronic Signature(s) Signed: 01/07/2022 12:29:04 PM By: [name] Shan DO Entered By: [name] Haney on 01/07/2022 12:24:41 -------------------------------------------------------------------------------- SuperBill  Details Patient Name: Date of Service: [name], [name] 01/07/2022 Medical Record Number: [id number] Patient Account Number: [id number] Date of Birth/Sex: Treating RN: Mar 11, 1951 (71 y.o. [name] Primary Care Provider: Leonard Haney Other Clinician: Referring Provider: Treating Provider/Extender: [name] Haney in Treatment: 3 Diagnosis Coding ICD-10 Codes Code Description [phone] Non-pressure chronic ulcer of other part of left foot with fat layer exposed T25.221A Burn of second degree of right foot, initial encounter E03.9 Hypothyroidism, unspecified Facility Procedures CPT4 Code: [id number] Description: [facility] - DEB SUBQ TISSUE 20 SQ CM/< ICD-10 Diagnosis Description L97.522 Non-pressure chronic ulcer of other part of left foot with fat layer exposed Modifier: Quantity: 1 Physician Procedures : CPT4 Code Description Modifier [id number] - WC PHYS SUBQ TISS 20 SQ CM ICD-10 Diagnosis Description L97.522 Non-pressure chronic ulcer of other part of left foot with fat layer exposed Quantity: 1 Electronic Signature(s) Signed: 01/07/2022 12:29:04 PM By: [name] Shan DO Entered By: [name] Haney on 01/07/2022 12:28:37
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
## 2022-01-08 ENCOUNTER — Other Ambulatory Visit: Payer: Self-pay | Admitting: Cardiology

## 2022-01-08 DIAGNOSIS — E7800 Pure hypercholesterolemia, unspecified: Secondary | ICD-10-CM

## 2022-01-08 MED ORDER — REPATHA SURECLICK 140 MG/ML ~~LOC~~ SOAJ: 140.0000 mg | SUBCUTANEOUS | 3 refills | Status: DC

## 2022-01-08 NOTE — Progress Notes (Signed)
Spoke to the patient today with regards to his recent lipid profile.  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  Lab Results  Component Value Date   CHOL 315 (H) 01/02/2022   HDL 38 (L) 01/02/2022   LDLCALC 247 (H) 01/02/2022   LDLDIRECT 227 (H) 01/02/2022   TRIG 154 (H) 01/02/2022   CHOLHDL 7.7 (H) 08/19/2016   Once the prescription is filled he is asked to come in for nurse visit to be taught how to use Repatha.  Once he has had 3 doses of Repatha I would recommend repeat fasting lipid profile, direct LDL, and CMP.  Patient is agreeable with the plan of care.  Telephone encounter: 5 minutes  Mechele Claude [facility]  Pager: [phone] Office: [phone]

## 2022-01-17 ENCOUNTER — Telehealth: Payer: Self-pay

## 2022-01-20 ENCOUNTER — Ambulatory Visit
Admission: RE | Admit: 2022-01-20 | Discharge: 2022-01-20 | Disposition: A | Discharge status: Home / Self Care | Payer: No Typology Code available for payment source | Source: Ambulatory Visit | Attending: Cardiology | Admitting: Cardiology

## 2022-01-20 ENCOUNTER — Encounter (HOSPITAL_BASED_OUTPATIENT_CLINIC_OR_DEPARTMENT_OTHER): Payer: Medicare Other | Attending: Internal Medicine | Admitting: Internal Medicine

## 2022-01-20 ENCOUNTER — Other Ambulatory Visit: Payer: Self-pay

## 2022-01-20 DIAGNOSIS — T25221A Burn of second degree of right foot, initial encounter: Secondary | ICD-10-CM | POA: Diagnosis not present

## 2022-01-20 DIAGNOSIS — E039 Hypothyroidism, unspecified: Secondary | ICD-10-CM

## 2022-01-20 DIAGNOSIS — G894 Chronic pain syndrome: Secondary | ICD-10-CM | POA: Diagnosis not present

## 2022-01-20 DIAGNOSIS — X58XXXA Exposure to other specified factors, initial encounter: Secondary | ICD-10-CM | POA: Diagnosis not present

## 2022-01-20 DIAGNOSIS — L97522 Non-pressure chronic ulcer of other part of left foot with fat layer exposed: Secondary | ICD-10-CM | POA: Diagnosis present

## 2022-01-20 DIAGNOSIS — E7800 Pure hypercholesterolemia, unspecified: Secondary | ICD-10-CM

## 2022-01-20 NOTE — Progress Notes (Signed)
BUELL, PARCEL ([id number]) Visit Report for 01/20/2022 Chief Complaint Document Details Patient Name: Date of Service: [facility], [place] R. 01/20/2022 10:30 A M Medical Record Number: [id number] Patient Account Number: [id number] Date of Birth/Sex: Treating RN: 04/26/1951 (71 y.o. M) Primary Care Provider: Leonard Downing Other Clinician: Referring Provider: Treating Provider/Extender: [name]: 5 Information Obtained from: Patient Chief Complaint Left foot wound Electronic Signature(s) Signed: 01/20/2022 11:03:52 AM By: Kalman Shan DO Entered By: Kalman Shan on 01/20/2022 11:00:40 -------------------------------------------------------------------------------- HPI Details Patient Name: Date of Service: [name], Yoshimi R. 01/20/2022 10:30 A M Medical Record Number: [id number] Patient Account Number: [id number] Date of Birth/Sex: Treating RN: September 08, 1951 (71 y.o. M) Primary Care Provider: Leonard Downing Other Clinician: Referring Provider: Treating Provider/Extender: [name]: 5 History of Present Illness HPI Description: Admission 12/16/2021 Mr. Esaiah Wanless is a 71 year old male with a past medical history of hypothyroidism and chronic pain syndrome that presents to the clinic for a burn to his left foot one month ago. He states he has electric socks that keep his feet warm and these caused the burn. He denies a history of neuropathy. He started using an ointment recently to the burn site but is not sure of the name. Prior to that he has just been keeping the area covered. The wound has been stable over the past month with little improvement in healing. He currently denies signs of infection. 2/6; patient presents for follow-up. He states he is having trouble keeping the [facility] in place. He denies signs of infection. He reports improvement in wound healing. 2/13;  patient presents for follow-up. He has no issues or complaints today. He has found a Band-Aid that has helped keep Hydrofera Blue nicely in place. He denies signs of infection. He reports improvement in wound healing. 2/21; patient presents for follow-up. He has no issues or complaints today. He has been using Hydrofera Blue to the wound bed without issues. He continues to report improvement in wound healing. 3/6; patient presents for follow-up. He has been using Hydrofera Blue to the bed without issues. He reports there has been no drainage for the past week. He reports improvement in wound healing. He has no issues or complaints today. Electronic Signature(s) Signed: 01/20/2022 11:03:52 AM By: Kalman Shan DO Entered By: Kalman Shan on 01/20/2022 11:01:09 -------------------------------------------------------------------------------- Physical Exam Details Patient Name: Date of Service: [name], Paris R. 01/20/2022 10:30 A M Medical Record Number: [id number] Patient Account Number: [id number] Date of Birth/Sex: Treating RN: 11-28-1950 (71 y.o. M) Primary Care Provider: Leonard Downing Other Clinician: Referring Provider: Treating Provider/Extender: Dellia Nims Weeks in Treatment: 5 Constitutional respirations regular, non-labored and within target range for patient.. Cardiovascular 2+ dorsalis pedis/posterior tibialis pulses. Psychiatric pleasant and cooperative. Notes Left foot: T the medial calcaneus there is epithelization to the previous wound site. o Electronic Signature(s) Signed: 01/20/2022 11:03:52 AM By: Kalman Shan DO Entered By: Kalman Shan on 01/20/2022 11:01:46 -------------------------------------------------------------------------------- Physician Orders Details Patient Name: Date of Service: [name], Teryl R. 01/20/2022 10:30 A M Medical Record Number: [id number] Patient Account Number: [id number] Date of Birth/Sex:  Treating RN: July 23, 1951 (72 y.o. [name] Primary Care Provider: Leonard Downing Other Clinician: Referring Provider: Treating Provider/Extender: [name]: 5 Verbal / Phone Orders: No Diagnosis Coding ICD-10 Coding Code Description [phone] Non-pressure chronic ulcer of other part of left foot with fat layer exposed  T25.221A Burn of second degree of right foot, initial encounter E03.9 Hypothyroidism, unspecified Discharge From [facility] Services Discharge from [place] - Call if any future wound care needs. lotion both legs and feet every night before bed. Electronic Signature(s) Signed: 01/20/2022 11:03:52 AM By: Kalman Shan DO Entered By: Kalman Shan on 01/20/2022 11:02:04 -------------------------------------------------------------------------------- Problem List Details Patient Name: Date of Service: [name], Miroslav R. 01/20/2022 10:30 A M Medical Record Number: [id number] Patient Account Number: [id number] Date of Birth/Sex: Treating RN: Jul 31, 1951 (71 y.o. [name], Tammi Klippel Primary Care Provider: Leonard Downing Other Clinician: Referring Provider: Treating Provider/Extender: [name]: 5 Active Problems ICD-10 Encounter Code Description Active Date MDM Diagnosis [phone] Non-pressure chronic ulcer of other part of left foot with fat layer exposed 12/16/2021 No Yes T25.221A Burn of second degree of right foot, initial encounter 12/16/2021 No Yes E03.9 Hypothyroidism, unspecified 12/16/2021 No Yes Inactive Problems Resolved Problems Electronic Signature(s) Signed: 01/20/2022 11:03:52 AM By: Kalman Shan DO Entered By: Kalman Shan on 01/20/2022 11:00:15 -------------------------------------------------------------------------------- Progress Note Details Patient Name: Date of Service: [name], Dolphus R. 01/20/2022 10:30 A M Medical Record  Number: [id number] Patient Account Number: [id number] Date of Birth/Sex: Treating RN: 27-Dec-1950 (71 y.o. M) Primary Care Provider: Leonard Downing Other Clinician: Referring Provider: Treating Provider/Extender: [name]: 5 Subjective Chief Complaint Information obtained from Patient Left foot wound History of Present Illness (HPI) Admission 12/16/2021 Mr. Aquiles Ruffini is a 71 year old male with a past medical history of hypothyroidism and chronic pain syndrome that presents to the clinic for a burn to his left foot one month ago. He states he has electric socks that keep his feet warm and these caused the burn. He denies a history of neuropathy. He started using an ointment recently to the burn site but is not sure of the name. Prior to that he has just been keeping the area covered. The wound has been stable over the past month with little improvement in healing. He currently denies signs of infection. 2/6; patient presents for follow-up. He states he is having trouble keeping the [facility] in place. He denies signs of infection. He reports improvement in wound healing. 2/13; patient presents for follow-up. He has no issues or complaints today. He has found a Band-Aid that has helped keep Hydrofera Blue nicely in place. He denies signs of infection. He reports improvement in wound healing. 2/21; patient presents for follow-up. He has no issues or complaints today. He has been using Hydrofera Blue to the wound bed without issues. He continues to report improvement in wound healing. 3/6; patient presents for follow-up. He has been using Hydrofera Blue to the bed without issues. He reports there has been no drainage for the past week. He reports improvement in wound healing. He has no issues or complaints today. Patient History Information obtained from Patient. Family History Heart Disease - Father,Mother, Hypertension -  Mother,Father, Stroke - Father, Thyroid Problems - Mother, No family history of Cancer, Diabetes, Hereditary Spherocytosis, Kidney Disease, Lung Disease, Seizures, Tuberculosis. Social History Former smoker - quit 1989, Marital Status - Married, Alcohol Use - Never, Drug Use - Current History - marjuania, Caffeine Use - Daily - coffee. Medical History Eyes Patient has history of Cataracts - starting Denies history of Glaucoma, Optic Neuritis Ear/Nose/Mouth/Throat Denies history of Chronic sinus problems/congestion, Middle ear problems Hematologic/Lymphatic Denies history of Anemia, Hemophilia, Human Immunodeficiency Virus, Lymphedema, Sickle Cell Disease Respiratory Patient has  history of Asthma Denies history of Aspiration, Chronic Obstructive Pulmonary Disease (COPD), Pneumothorax, Sleep Apnea, Tuberculosis Cardiovascular Denies history of Angina, Arrhythmia, Congestive Heart Failure, Coronary Artery Disease, Deep Vein Thrombosis, Hypertension, Hypotension, Myocardial Infarction, Peripheral Arterial Disease, Peripheral Venous Disease, Phlebitis, Vasculitis Gastrointestinal Denies history of Cirrhosis , Colitis, Crohnoos, Hepatitis A, Hepatitis B, Hepatitis C Endocrine Denies history of Type I Diabetes, Type II Diabetes Genitourinary Denies history of End Stage Renal Disease Immunological Denies history of Lupus Erythematosus, Raynaudoos, Scleroderma Integumentary (Skin) Denies history of History of Burn Musculoskeletal Patient has history of Rheumatoid Arthritis, Osteoarthritis Denies history of Gout, Osteomyelitis Neurologic Denies history of Dementia, Neuropathy, Quadriplegia, Paraplegia, Seizure Disorder Oncologic Denies history of Received Chemotherapy, Received Radiation Psychiatric Denies history of Anorexia/bulimia, Confinement Anxiety Medical A Surgical History Notes nd Constitutional Symptoms (General Health) hypothyroidism Objective Constitutional respirations  regular, non-labored and within target range for patient.. Vitals Time Taken: 10:20 AM, Height: 67 in, Weight: 208 lbs, BMI: 32.6, Temperature: 98.6 F, Pulse: 68 bpm, Respiratory Rate: 20 breaths/min, Blood Pressure: 107/70 mmHg. Cardiovascular 2+ dorsalis pedis/posterior tibialis pulses. Psychiatric pleasant and cooperative. General Notes: Left foot: T the medial calcaneus there is epithelization to the previous wound site. o Integumentary (Hair, Skin) Wound #1 status is Open. Original cause of wound was Thermal Burn. The date acquired was: 11/15/2021. The wound has been in treatment 5 weeks. The wound is located on the Left,Medial Calcaneus. The wound measures 0cm length x 0cm width x 0cm depth; 0cm^2 area and 0cm^3 volume. There is Fat Layer (Subcutaneous Tissue) exposed. There is no tunneling or undermining noted. There is a none present amount of drainage noted. The wound margin is distinct with the outline attached to the wound base. There is no granulation within the wound bed. There is no necrotic tissue within the wound bed. Assessment Active Problems ICD-10 Non-pressure chronic ulcer of other part of left foot with fat layer exposed Burn of second degree of right foot, initial encounter Hypothyroidism, unspecified Patient has done well with Hydrofera Blue. His wound is healed. I recommended lotion nightly to the heel. He may follow-up as needed. Plan Discharge From [facility] Services: Discharge from [place] - Call if any future wound care needs. lotion both legs and feet every night before bed. 1. Discharge from clinic due to closed wound 2. Follow-up as needed 3. Lotion to the heel nightly Electronic Signature(s) Signed: 01/20/2022 11:03:52 AM By: Kalman Shan DO Entered By: Kalman Shan on 01/20/2022 11:03:12 -------------------------------------------------------------------------------- HxROS Details Patient Name: Date of Service: [name], Jahaan R. 01/20/2022  10:30 A M Medical Record Number: [id number] Patient Account Number: [id number] Date of Birth/Sex: Treating RN: 08/19/1951 (71 y.o. M) Primary Care Provider: Leonard Downing Other Clinician: Referring Provider: Treating Provider/Extender: [name]: 5 Information Obtained From Patient Constitutional Symptoms (General Health) Medical History: Past Medical History Notes: hypothyroidism Eyes Medical History: Positive for: Cataracts - starting Negative for: Glaucoma; Optic Neuritis Ear/Nose/Mouth/Throat Medical History: Negative for: Chronic sinus problems/congestion; Middle ear problems Hematologic/Lymphatic Medical History: Negative for: Anemia; Hemophilia; Human Immunodeficiency Virus; Lymphedema; Sickle Cell Disease Respiratory Medical History: Positive for: Asthma Negative for: Aspiration; Chronic Obstructive Pulmonary Disease (COPD); Pneumothorax; Sleep Apnea; Tuberculosis Cardiovascular Medical History: Negative for: Angina; Arrhythmia; Congestive Heart Failure; Coronary Artery Disease; Deep Vein Thrombosis; Hypertension; Hypotension; Myocardial Infarction; Peripheral Arterial Disease; Peripheral Venous Disease; Phlebitis; Vasculitis Gastrointestinal Medical History: Negative for: Cirrhosis ; Colitis; Crohns; Hepatitis A; Hepatitis B; Hepatitis C Endocrine Medical History: Negative for: Type I  Diabetes; Type II Diabetes Genitourinary Medical History: Negative for: End Stage Renal Disease Immunological Medical History: Negative for: Lupus Erythematosus; Raynauds; Scleroderma Integumentary (Skin) Medical History: Negative for: History of Burn Musculoskeletal Medical History: Positive for: Rheumatoid Arthritis; Osteoarthritis Negative for: Gout; Osteomyelitis Neurologic Medical History: Negative for: Dementia; Neuropathy; Quadriplegia; Paraplegia; Seizure Disorder Oncologic Medical History: Negative for:  Received Chemotherapy; Received Radiation Psychiatric Medical History: Negative for: Anorexia/bulimia; Confinement Anxiety HBO Extended History Items Eyes: Cataracts Immunizations Pneumococcal Vaccine: Received Pneumococcal Vaccination: No Implantable Devices No devices added Family and Social History Cancer: No; Diabetes: No; Heart Disease: Yes - Father,Mother; Hereditary Spherocytosis: No; Hypertension: Yes - Mother,Father; Kidney Disease: No; Lung Disease: No; Seizures: No; Stroke: Yes - Father; Thyroid Problems: Yes - Mother; Tuberculosis: No; Former smoker - quit 1989; Marital Status - Married; Alcohol Use: Never; Drug Use: Current History - marjuania; Caffeine Use: Daily - coffee; Financial Concerns: No; Food, Clothing or Shelter Needs: No; Support System Lacking: No; Transportation Concerns: No Electronic Signature(s) Signed: 01/20/2022 11:03:52 AM By: Kalman Shan DO Entered By: Kalman Shan on 01/20/2022 11:01:16 -------------------------------------------------------------------------------- SuperBill Details Patient Name: Date of Service: [name], Theseus R. 01/20/2022 Medical Record Number: [id number] Patient Account Number: [id number] Date of Birth/Sex: Treating RN: 09/03/1951 (71 y.o. [name] Primary Care Provider: Leonard Downing Other Clinician: Referring Provider: Treating Provider/Extender: [name]: 5 Diagnosis Coding ICD-10 Codes Code Description [phone] Non-pressure chronic ulcer of other part of left foot with fat layer exposed T25.221A Burn of second degree of right foot, initial encounter E03.9 Hypothyroidism, unspecified Facility Procedures CPT4 Code: [id number] Description: 99213 - WOUND CARE VISIT-LEV 3 EST PT Modifier: Quantity: 1 Physician Procedures : CPT4 Code Description Modifier [id number] - WC PHYS LEVEL 3 - EST PT ICD-10 Diagnosis Description L97.522 Non-pressure chronic  ulcer of other part of left foot with fat layer exposed T25.221A Burn of second degree of right foot, initial encounter  E03.9 Hypothyroidism, unspecified Quantity: 1 Electronic Signature(s) Signed: 01/20/2022 11:03:52 AM By: Kalman Shan DO Entered By: Kalman Shan on 01/20/2022 11:03:25

## 2022-01-21 ENCOUNTER — Ambulatory Visit: Payer: Medicare Other | Admitting: Cardiology

## 2022-01-21 DIAGNOSIS — E7800 Pure hypercholesterolemia, unspecified: Secondary | ICD-10-CM

## 2022-01-21 MED ORDER — EVOLOCUMAB 140 MG/ML ~~LOC~~ SOAJ
140.0000 mg | Freq: Once | SUBCUTANEOUS | Status: AC
  Administered 2022-01-21: 140 mg via SUBCUTANEOUS

## 2022-01-21 NOTE — Progress Notes (Signed)
ICD-10-CM   ?1. Pure hypercholesterolemia  E78.00 Evolocumab SOAJ 140 mg  ?  ? ?Administration Action Time Recorded Time Documented By Site Comment Reason Patient Supplied  ?Given : 140 mg :   : Subcutaneous 01/21/22 1656 01/21/22 1701 [place], [place] Left Anterior Thigh   Yes  ? ?
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
## 2022-01-23 ENCOUNTER — Ambulatory Visit: Payer: Medicare Other | Admitting: Cardiology

## 2022-01-30 NOTE — Progress Notes (Signed)
Spoke to patient he wants to know what does a score of 57 mean and where is his hernia located  ? ?I will fax over results to pcp

## 2022-02-04 ENCOUNTER — Other Ambulatory Visit: Payer: Self-pay

## 2022-02-04 ENCOUNTER — Ambulatory Visit: Payer: Self-pay | Admitting: Cardiology

## 2022-02-04 DIAGNOSIS — E7800 Pure hypercholesterolemia, unspecified: Secondary | ICD-10-CM

## 2022-02-04 MED ORDER — EVOLOCUMAB 140 MG/ML ~~LOC~~ SOAJ
140.0000 mg | Freq: Once | SUBCUTANEOUS | Status: AC
  Administered 2022-02-04: 140 mg via SUBCUTANEOUS

## 2022-02-04 MED ORDER — REPATHA SURECLICK 140 MG/ML ~~LOC~~ SOAJ: 140.0000 mg | SUBCUTANEOUS | 3 refills | Status: AC

## 2022-02-06 LAB — CMP14+EGFR
ALT: 6 IU/L (ref 0–44)
AST: 18 IU/L (ref 0–40)
Albumin/Globulin Ratio: 1.4 (ref 1.2–2.2)
Albumin: 4.4 g/dL (ref 3.7–4.7)
Alkaline Phosphatase: 114 IU/L (ref 44–121)
BUN/Creatinine Ratio: 12 (ref 10–24)
BUN: 17 mg/dL (ref 8–27)
Bilirubin Total: 0.5 mg/dL (ref 0.0–1.2)
CO2: 21 mmol/L (ref 20–29)
Calcium: 9.9 mg/dL (ref 8.6–10.2)
Chloride: 102 mmol/L (ref 96–106)
Creatinine, Ser: 1.46 mg/dL — ABNORMAL HIGH (ref 0.76–1.27)
Globulin, Total: 3.1 g/dL (ref 1.5–4.5)
Glucose: 117 mg/dL — ABNORMAL HIGH (ref 70–99)
Potassium: 5.4 mmol/L — ABNORMAL HIGH (ref 3.5–5.2)
Sodium: 142 mmol/L (ref 134–144)
Total Protein: 7.5 g/dL (ref 6.0–8.5)
eGFR: 51 mL/min/{1.73_m2} — ABNORMAL LOW (ref >59)

## 2022-02-06 LAB — LIPID PANEL WITH LDL/HDL RATIO
Cholesterol, Total: 198 mg/dL (ref 100–199)
HDL: 41 mg/dL (ref >39)
LDL Chol Calc (NIH): 128 mg/dL — ABNORMAL HIGH (ref 0–99)
LDL/HDL Ratio: 3.1 ratio (ref 0.0–3.6)
Triglycerides: 161 mg/dL — ABNORMAL HIGH (ref 0–149)
VLDL Cholesterol Cal: 29 mg/dL (ref 5–40)

## 2022-02-06 LAB — LDL CHOLESTEROL, DIRECT: LDL Direct: 115 mg/dL — ABNORMAL HIGH (ref 0–99)

## 2022-02-07 NOTE — Progress Notes (Signed)
Spoke to patient he is scheduled to come in next month he is aware of results

## 2022-02-19 ENCOUNTER — Other Ambulatory Visit: Payer: Self-pay

## 2022-02-19 DIAGNOSIS — E7800 Pure hypercholesterolemia, unspecified: Secondary | ICD-10-CM

## 2022-02-20 ENCOUNTER — Ambulatory Visit: Payer: Medicare Other | Admitting: Cardiology

## 2022-03-04 ENCOUNTER — Ambulatory Visit: Payer: Medicare Other | Admitting: Cardiology

## 2022-03-04 ENCOUNTER — Encounter: Payer: Self-pay | Admitting: Cardiology

## 2022-03-04 VITALS — BP 133/84 | HR 61 | Temp 98.0°F | Resp 17 | Ht 67.0 in | Wt 207.0 lb

## 2022-03-04 DIAGNOSIS — E7800 Pure hypercholesterolemia, unspecified: Secondary | ICD-10-CM

## 2022-03-04 DIAGNOSIS — Z789 Other specified health status: Secondary | ICD-10-CM

## 2022-03-04 DIAGNOSIS — R931 Abnormal findings on diagnostic imaging of heart and coronary circulation: Secondary | ICD-10-CM

## 2022-03-04 DIAGNOSIS — E875 Hyperkalemia: Secondary | ICD-10-CM

## 2022-03-04 DIAGNOSIS — F1210 Cannabis abuse, uncomplicated: Secondary | ICD-10-CM

## 2022-03-04 MED ORDER — ROSUVASTATIN CALCIUM 10 MG PO TABS: 10.0000 mg | ORAL_TABLET | Freq: Every evening | ORAL | 0 refills | Status: DC

## 2022-03-04 MED ORDER — ASPIRIN EC 81 MG PO TBEC: 81.0000 mg | DELAYED_RELEASE_TABLET | Freq: Every day | ORAL | 11 refills | Status: DC

## 2022-03-04 MED ORDER — EVOLOCUMAB 140 MG/ML ~~LOC~~ SOAJ
140.0000 mg | Freq: Once | SUBCUTANEOUS | Status: AC
  Administered 2022-03-04: 140 mg via SUBCUTANEOUS
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
## 2022-06-10 ENCOUNTER — Other Ambulatory Visit: Payer: Self-pay | Admitting: Cardiology

## 2022-06-10 DIAGNOSIS — E7800 Pure hypercholesterolemia, unspecified: Secondary | ICD-10-CM

## 2022-08-23 LAB — BASIC METABOLIC PANEL
BUN/Creatinine Ratio: 12 (ref 10–24)
BUN: 15 mg/dL (ref 8–27)
CO2: 25 mmol/L (ref 20–29)
Calcium: 9.3 mg/dL (ref 8.6–10.2)
Chloride: 102 mmol/L (ref 96–106)
Creatinine, Ser: 1.26 mg/dL (ref 0.76–1.27)
Glucose: 82 mg/dL (ref 70–99)
Potassium: 4.2 mmol/L (ref 3.5–5.2)
Sodium: 142 mmol/L (ref 134–144)
eGFR: 61 mL/min/{1.73_m2} (ref >59)

## 2022-08-25 ENCOUNTER — Other Ambulatory Visit: Payer: Medicare Other

## 2022-08-26 ENCOUNTER — Ambulatory Visit: Payer: Medicare Other

## 2022-08-26 DIAGNOSIS — R931 Abnormal findings on diagnostic imaging of heart and coronary circulation: Secondary | ICD-10-CM

## 2022-09-01 ENCOUNTER — Other Ambulatory Visit: Payer: Self-pay | Admitting: Cardiology

## 2022-09-01 ENCOUNTER — Ambulatory Visit: Payer: Medicare Other

## 2022-09-01 DIAGNOSIS — R931 Abnormal findings on diagnostic imaging of heart and coronary circulation: Secondary | ICD-10-CM

## 2022-09-01 DIAGNOSIS — Z789 Other specified health status: Secondary | ICD-10-CM

## 2022-09-01 DIAGNOSIS — E7800 Pure hypercholesterolemia, unspecified: Secondary | ICD-10-CM

## 2022-09-01 NOTE — Progress Notes (Signed)
Pt was at office for an stress test. Pt understood and will due his labs tomorrow.

## 2022-09-03 LAB — HEPATIC FUNCTION PANEL
ALT: 8 IU/L (ref 0–44)
AST: 19 IU/L (ref 0–40)
Albumin: 4.5 g/dL (ref 3.8–4.8)
Alkaline Phosphatase: 96 IU/L (ref 44–121)
Bilirubin Total: 0.4 mg/dL (ref 0.0–1.2)
Bilirubin, Direct: 0.12 mg/dL (ref 0.00–0.40)
Total Protein: 7.3 g/dL (ref 6.0–8.5)

## 2022-09-03 LAB — LIPID PANEL WITH LDL/HDL RATIO
Cholesterol, Total: 217 mg/dL — ABNORMAL HIGH (ref 100–199)
HDL: 43 mg/dL (ref >39)
LDL Chol Calc (NIH): 149 mg/dL — ABNORMAL HIGH (ref 0–99)
LDL/HDL Ratio: 3.5 ratio (ref 0.0–3.6)
Triglycerides: 140 mg/dL (ref 0–149)
VLDL Cholesterol Cal: 25 mg/dL (ref 5–40)

## 2022-09-03 LAB — LDL CHOLESTEROL, DIRECT: LDL Direct: 154 mg/dL — ABNORMAL HIGH (ref 0–99)

## 2022-09-03 NOTE — Progress Notes (Signed)
Tried calling patient no answer left a detailed message

## 2022-09-04 ENCOUNTER — Other Ambulatory Visit: Payer: Self-pay | Admitting: Cardiology

## 2022-09-04 ENCOUNTER — Ambulatory Visit: Payer: Medicare Other | Admitting: Cardiology

## 2022-09-04 ENCOUNTER — Encounter: Payer: Self-pay | Admitting: Cardiology

## 2022-09-04 VITALS — BP 111/82 | HR 69 | Temp 96.4°F | Resp 16 | Ht 67.0 in | Wt 218.0 lb

## 2022-09-04 DIAGNOSIS — Z789 Other specified health status: Secondary | ICD-10-CM

## 2022-09-04 DIAGNOSIS — R9439 Abnormal result of other cardiovascular function study: Secondary | ICD-10-CM

## 2022-09-04 DIAGNOSIS — R931 Abnormal findings on diagnostic imaging of heart and coronary circulation: Secondary | ICD-10-CM

## 2022-09-04 DIAGNOSIS — F1210 Cannabis abuse, uncomplicated: Secondary | ICD-10-CM

## 2022-09-04 DIAGNOSIS — E7800 Pure hypercholesterolemia, unspecified: Secondary | ICD-10-CM

## 2022-09-04 DIAGNOSIS — R0609 Other forms of dyspnea: Secondary | ICD-10-CM

## 2022-09-04 MED ORDER — REPATHA SURECLICK 140 MG/ML ~~LOC~~ SOAJ: 140.0000 mg | SUBCUTANEOUS | 3 refills | Status: DC

## 2022-09-04 MED ORDER — NITROGLYCERIN 0.4 MG SL SUBL: 0.4000 mg | SUBLINGUAL_TABLET | SUBLINGUAL | 0 refills | Status: AC | PRN

## 2022-09-04 NOTE — Progress Notes (Signed)
Date:  09/04/2022   ID:  [name], DOB [birth year], MRN [id number]  PCP:  Leonard Downing, MD  Cardiologist:  Rex Kras, DO, [facility] (established care 12/24/21 )  Date: 09/04/22 Last Office Visit: 03/04/2022  Chief Complaint  Patient presents with   Follow-up    Shortness of breath. Reviewed test results. Hyperlipidemia    HPI  [name] Haney is a 71 y.o. Caucasian male whose past medical history and cardiovascular risk factors include: Mild coronary artery calcification, pure hypercholesterolemia, hypothyroidism, former smoker, smoking marijuana (everyday).  Initially referred to the practice for evaluation of hyperlipidemia and statin intolerance.  Patient subsequently underwent coronary calcium score which noted mild CAC.  Due to statin intolerance he was requested to be on PCSK9 inhibitor.  He started Repatha; however, he does not take it regularly.  Patient states that he stretches it every 2 to 3 weeks max.  Repeat lipids are not well controlled.  He was unable to tolerate Crestor 10 mg p.o. nightly due to leg cramps.  He stopped taking aspirin as he lost the bottle.  In addition, in the past patient been experiencing shortness of breath with effort related activities and given his risk factors especially hypercholesterolemia shared decision was to proceed with stress test.  Results reviewed with the patient at today's office visit.  He denies anginal discomfort but continues to have shortness of breath with overexertion.  FUNCTIONAL STATUS: No structured exercise program or daily routine.   ALLERGIES: Allergies  Allergen Reactions   Avelox [Moxifloxacin Hcl In Nacl]     UNSPECIFIED REACTION     MEDICATION LIST PRIOR TO VISIT: Current Meds  Medication Sig   gabapentin (NEURONTIN) 800 MG tablet Take 800 mg by mouth at bedtime.   nitroGLYCERIN (NITROSTAT) 0.4 MG SL tablet Place 1 tablet (0.4 mg total) under the tongue every 5 (five) minutes as needed for chest  pain.   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   Coronary artery calcification    Depression    GERD (gastroesophageal reflux disease)    Hypothyroidism    Substance abuse ([place])    marijuana   Thyroid disease     PAST SURGICAL HISTORY: Past Surgical History:  Procedure Laterality Date   APPENDECTOMY     LESION REMOVAL Right 03/18/2017   Procedure: LESION REMOVAL;  Surgeon: Izora Gala, MD;  Location: [place];  Service: ENT;  Laterality: Right;  excision of right ear skin cancer with skin graft   LUMBAR DISC SURGERY     x 2   MEDIAL PARTIAL KNEE REPLACEMENT Right 2009   NECK SURGERY  2006   SKIN SPLIT GRAFT Right 03/18/2017   Procedure: SKIN GRAFT SPLIT THICKNESS/RIGHT EAR;  Surgeon: Izora Gala, MD;  Location: [facility];  Service: ENT;  Laterality: Right;    FAMILY HISTORY: The patient family history includes Arthritis in his mother; Diabetes (age of onset: 9) in his mother; Heart disease (age of onset: 73) in his mother; Stroke (age of onset: 106) in his father.  SOCIAL HISTORY:  The patient  reports that he quit smoking about 34 years ago. His smoking use included cigarettes. He has a 7.50 pack-year smoking history. He has never used smokeless tobacco.  He reports current drug use. Drug: Marijuana. He reports that he does not drink alcohol.  REVIEW OF SYSTEMS: Review of Systems  Cardiovascular:  Positive for dyspnea on exertion. Negative for chest pain, leg swelling, orthopnea, palpitations, paroxysmal nocturnal dyspnea and syncope.  Respiratory:  Negative for cough and shortness of breath.     PHYSICAL EXAM:    09/04/2022   10:00 AM 03/04/2022    1:44 PM 12/24/2021   10:21 AM  Vitals with BMI  Height _0  _1  _2   Weight 218 lbs 207 lbs 216 lbs  BMI 34.14 77.82 42.35   Systolic 361 443 154  Diastolic 82 84 80  Pulse 69 61 75   Physical Exam  Constitutional: No distress.  Age appropriate, hemodynamically stable.   Neck: No JVD present.  Cardiovascular: Normal rate, regular rhythm, S1 normal, S2 normal and intact distal pulses. Exam reveals no gallop, no S3 and no S4.  No murmur heard. Pulses:      Radial pulses are 2+ on the right side and 2+ on the left side.       Dorsalis pedis pulses are 2+ on the right side and 2+ on the left side.       Posterior tibial pulses are 0 on the right side and 1+ on the left side.  Pulmonary/Chest: Effort normal. No stridor. He has no rales. He has scattered wheezes.  Abdominal: Soft. Bowel sounds are normal. He exhibits no distension. There is no abdominal tenderness.  Obese  Musculoskeletal:        General: No edema.     Cervical back: Neck supple.  Neurological: He is alert and oriented to person, place, and time. He has intact cranial nerves (2-12).  Skin: Skin is warm and moist.   CARDIAC DATABASE: EKG: 09/04/2022: Normal sinus rhythm, 77 bpm, minimal T wave inversions in inferolateral leads consider ischemia, without underlying injury pattern.  Echocardiogram: 08/26/2022: Left ventricle cavity is normal in size. Mild basal septal hypertrophy. Normal global wall motion. Normal LV systolic function with EF 56%. Normal diastolic filling pattern. Calculated EF 56%. Mild mitral valve leaflet thickening. Mild (Grade I) mitral regurgitation. Small circumferential pericardial effusion or pericardial fat.  Normal right atrial pressure.    Stress Testing: Exercise nuclear stress test 09/01/2022: There is a reversible moderate defect in the lateral, inferior and apical regions.  Overall LV systolic function is abnormal with inferior and inferolateral hypokinesis. Stress LV EF: 49%.  Normal ECG stress. The blood pressure response was normal. The patient exercised for 4 minutes and 20 seconds of a Bruce protocol,  achieving approximately 6.25 METs &  91% of MPHR.  No chest pain. Stress terminated due to dyspnea.  No previous exam available for comparison. Intermediate risk study.    Heart Catheterization: None  CAC Scoring  01/21/2022: 1. Coronary calcium score of 57 is at the 33rd percentile for the patient's age, sex and race. 2. Large hiatal hernia. The distal esophagus just proximal to the herniated stomach appears circumferentially thickened. This is nonspecific and may reflect inflammation from chronic reflux. Correlation suggested with any clinical symptoms or prior endoscopy.  LABORATORY DATA:    Latest Ref Rng & Units 03/12/2017    2:30 PM 08/19/2016    2:38 PM 02/21/2014    9:43 AM  CBC  WBC 4.0 - 10.5 K/uL 10.9  9.8  10.9   Hemoglobin 13.0 - 17.0 g/dL 14.3  15.1  17.0   Hematocrit 39.0 - 52.0 % 40.9  44.6  49.5   Platelets 150 - 400 K/uL 235  279  251.0        Latest Ref Rng & Units 09/02/2022    2:43 PM 08/22/2022    2:59 PM 02/05/2022   10:00 AM  CMP  Glucose 70 - 99 mg/dL  82  117   BUN 8 - 27 mg/dL  15  17   Creatinine 0.76 - 1.27 mg/dL  1.26  1.46   Sodium 134 - 144 mmol/L  142  142   Potassium 3.5 - 5.2 mmol/L  4.2  5.4   Chloride 96 - 106 mmol/L  102  102   CO2 20 - 29 mmol/L  25  21   Calcium 8.6 - 10.2 mg/dL  9.3  9.9   Total Protein 6.0 - 8.5 g/dL 7.3   7.5   Total Bilirubin 0.0 - 1.2 mg/dL 0.4   0.5   Alkaline Phos 44 - 121 IU/L 96   114   AST 0 - 40 IU/L 19   18   ALT 0 - 44 IU/L 8   6     Lipid Panel  Lab Results  Component Value Date   CHOL 217 (H) 09/02/2022   HDL 43 09/02/2022   LDLCALC 149 (H) 09/02/2022   LDLDIRECT 154 (H) 09/02/2022   TRIG 140 09/02/2022   CHOLHDL 7.7 (H) 08/19/2016     No components found for: "NTPROBNP" No results for input(s): "PROBNP" in the last 8760 hours. No results for input(s): "TSH" in the last 8760 hours.  BMP Recent Labs    01/02/22 1058 02/05/22 1000 08/22/22 1459  NA 144 142 142  K 4.7 5.4* 4.2  CL 105  102 102  CO2 _0 GLUCOSE 97 117* 82  BUN _1 CREATININE 1.25 1.46* 1.26  CALCIUM 9.5 9.9 9.3    HEMOGLOBIN A1C Lab Results  Component Value Date   HGBA1C 5.1 08/19/2016   MPG 100 08/19/2016   External Labs: Collected: 01/02/2022   CHOL 315 (H) 01/02/2022    HDL 38 (L) 01/02/2022    LDLCALC 247 (H) 01/02/2022    LDLDIRECT 227 (H) 01/02/2022    TRIG 154 (H) 01/02/2022   Collected: 12/02/2021 provided by PCP. Hemoglobin 15.1 g/dL, hematocrit 44.1%. BUN 22, creatinine 1.62. eGFR 45. Sodium 138, potassium 4.6, chloride 104, bicarb 22. AST 15, ALT 7, alkaline phosphatase 100 (within normal limits).  Collected: 04/23/2021. Total cholesterol 279, triglycerides 173, HDL 35, calculated LDL 211.  Collected: 05/04/2020 Total cholesterol 341, triglycerides 173, HDL 44, LDL 263 A1c 5.4  IMPRESSION:    ICD-10-CM   1. Abnormal nuclear stress test  R94.39 CBC    Basic metabolic panel    nitroGLYCERIN (NITROSTAT) 0.4 MG SL tablet    2. Dyspnea on exertion  R06.09 nitroGLYCERIN (NITROSTAT) 0.4 MG SL tablet    3. Agatston coronary artery calcium score less than 100  R93.1 EKG 12-Lead    REPATHA SURECLICK 562 MG/ML SOAJ    4. Pure hypercholesterolemia  Z30.86 REPATHA SURECLICK 578 MG/ML SOAJ    5. Statin intolerance  I69.6 REPATHA SURECLICK 295 MG/ML SOAJ    6. Marijuana abuse  F12.10        RECOMMENDATIONS: BRENTEN JANNEY is a 71 y.o. Caucasian male whose past medical history and cardiac risk factors include: HLD, hypothyroidism, former smoker, smoking marijuana (everyday).  Abnormal nuclear stress test / Dyspnea on exertion / agatston coronary artery calcium score less than 100  Given his dyspnea on exertion, CAC , and uncontrolled hyperlipidemia the shared decision was to proceed with stress test. The MPI notes reversible myocardial perfusion defect involving the inferior and inferolateral segments. Recommended coronary CTA to further evaluate for obstructive CAD  versus angiography.  After discussing the risks, benefits, alternatives, and limitations patient prefers to proceed with left heart catheterization with possible intervention. Restart aspirin 81 mg p.o. daily. We will prescribe as needed sublingual nitroglycerin tablet.  Patient is not on phosphodiesterase 5 inhibitors and is made aware of the drug to drug interactions. Refill Repatha.  Reemphasized the importance of taking it regularly  The procedure of left heart catheterization with possible intervention was explained to the patient in detail.  The indication, alternatives, risks and benefits were reviewed.  Complications include but not limited to bleeding, infection, vascular injury, stroke, myocardial infarction, arrhythmia (requiring medical or cardiopulmonary resuscitation), kidney injury (requiring short-term or long-term hemodialysis), radiation-related injury in the case of prolonged fluoroscopy use, emergent cardiac surgery, temporary or permanent pacemaker, and death. The patient understands the risks of serious complication is 1-2 in 6789 with diagnostic cardiac cath and 1-2% or less with angioplasty/stenting.  The patient voices understanding and provides verbal feedback his questions and concerns are addressed to his satisfaction and patient wishes to proceed with coronary angiography with possible PCI.   Pure hypercholesterolemia / Statin intolerance In June 2021 his LDL levels were as high as 263 mg/dL. Has been intolerant to statin medications in the past including Crestor. Was prescribed PCSK9 inhibitors which he takes but not in a timely fashion - " stretches it  out." Most recent lipid profile notes improvement in LDL but the LDL is not well controlled. Refill Repatha. Patient is encouraged to use a reminder on his smart phone to help with medication compliance.  Marijuana abuse Educated on importance of complete cessation of marijuana use.  Educated on seeking medical  attention sooner by going to the closest ER via EMS if the symptoms increase in intensity, frequency, duration, or has typical chest pain as discussed in the office.  Patient verbalized understanding.  FINAL MEDICATION LIST END OF ENCOUNTER: Meds ordered this encounter  Medications   REPATHA SURECLICK 381 MG/ML SOAJ    Sig: Inject 140 mg into the skin every 14 (fourteen) days for 8 doses.    Dispense:  2 mL    Refill:  3   nitroGLYCERIN (NITROSTAT) 0.4 MG SL tablet    Sig: Place 1 tablet (0.4 mg total) under the tongue every 5 (five) minutes as needed for chest pain.    Dispense:  30 tablet    Refill:  0     Medications Discontinued During This Encounter  Medication Reason   levothyroxine (SYNTHROID) 100 MCG tablet    rosuvastatin (CRESTOR) 10 MG tablet Patient Preference   REPATHA SURECLICK 017 MG/ML SOAJ Reorder     Current Outpatient Medications:    gabapentin (NEURONTIN) 800 MG tablet, Take 800 mg by mouth at bedtime., Disp: , Rfl:    nitroGLYCERIN (NITROSTAT) 0.4 MG SL tablet, Place 1 tablet (0.4 mg total) under the tongue every 5 (five) minutes as needed for chest pain., Disp: 30 tablet, Rfl: 0   aspirin EC 81 MG tablet, Take 1 tablet (81 mg total) by mouth daily. Swallow whole. (Patient not taking: Reported on 09/04/2022), Disp: 30 tablet, Rfl: 11   REPATHA SURECLICK 510 MG/ML SOAJ, Inject 140 mg into the skin every 14 (fourteen) days for 8 doses., Disp: 2 mL, Rfl:  3  Orders Placed This Encounter  Procedures   CBC   Basic metabolic panel   EKG 27-POEU    There are no Patient Instructions on file for this visit.   --Continue cardiac medications as reconciled in final medication list. --Return in about 9 weeks (around 11/06/2022) for Follow up, Post heart catheterization. Or sooner if needed. --Continue follow-up with your primary care physician regarding the management of your other chronic comorbid conditions.  Patient's questions and concerns were addressed to his  satisfaction. He voices understanding of the instructions provided during this encounter.   This note was created using a voice recognition software as a result there may be grammatical errors inadvertently enclosed that do not reflect the nature of this encounter. Every attempt is made to correct such errors.  Rex Kras, [place], [facility]  Pager: [phone] Office: [phone]

## 2022-09-04 NOTE — H&P (View-Only) (Signed)
Date:  09/04/2022   ID:  [name], DOB [birth year], MRN [id number]  PCP:  Leonard Downing, MD  Cardiologist:  Rex Kras, DO, [facility] (established care 12/24/21 )  Date: 09/04/22 Last Office Visit: 03/04/2022  Chief Complaint  Patient presents with   Follow-up    Shortness of breath. Reviewed test results. Hyperlipidemia    HPI  [name] Haney is a 71 y.o. Caucasian male whose past medical history and cardiovascular risk factors include: Mild coronary artery calcification, pure hypercholesterolemia, hypothyroidism, former smoker, smoking marijuana (everyday).  Initially referred to the practice for evaluation of hyperlipidemia and statin intolerance.  Patient subsequently underwent coronary calcium score which noted mild CAC.  Due to statin intolerance he was requested to be on PCSK9 inhibitor.  He started Repatha; however, he does not take it regularly.  Patient states that he stretches it every 2 to 3 weeks max.  Repeat lipids are not well controlled.  He was unable to tolerate Crestor 10 mg p.o. nightly due to leg cramps.  He stopped taking aspirin as he lost the bottle.  In addition, in the past patient been experiencing shortness of breath with effort related activities and given his risk factors especially hypercholesterolemia shared decision was to proceed with stress test.  Results reviewed with the patient at today's office visit.  He denies anginal discomfort but continues to have shortness of breath with overexertion.  FUNCTIONAL STATUS: No structured exercise program or daily routine.   ALLERGIES: Allergies  Allergen Reactions   Avelox [Moxifloxacin Hcl In Nacl]     UNSPECIFIED REACTION     MEDICATION LIST PRIOR TO VISIT: Current Meds  Medication Sig   gabapentin (NEURONTIN) 800 MG tablet Take 800 mg by mouth at bedtime.   nitroGLYCERIN (NITROSTAT) 0.4 MG SL tablet Place 1 tablet (0.4 mg total) under the tongue every 5 (five) minutes as needed for chest  pain.   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   Coronary artery calcification    Depression    GERD (gastroesophageal reflux disease)    Hypothyroidism    Substance abuse ([place])    marijuana   Thyroid disease     PAST SURGICAL HISTORY: Past Surgical History:  Procedure Laterality Date   APPENDECTOMY     LESION REMOVAL Right 03/18/2017   Procedure: LESION REMOVAL;  Surgeon: Izora Gala, MD;  Location: [place];  Service: ENT;  Laterality: Right;  excision of right ear skin cancer with skin graft   LUMBAR DISC SURGERY     x 2   MEDIAL PARTIAL KNEE REPLACEMENT Right 2009   NECK SURGERY  2006   SKIN SPLIT GRAFT Right 03/18/2017   Procedure: SKIN GRAFT SPLIT THICKNESS/RIGHT EAR;  Surgeon: Izora Gala, MD;  Location: [facility];  Service: ENT;  Laterality: Right;    FAMILY HISTORY: The patient family history includes Arthritis in his mother; Diabetes (age of onset: 9) in his mother; Heart disease (age of onset: 73) in his mother; Stroke (age of onset: 106) in his father.  SOCIAL HISTORY:  The patient  reports that he quit smoking about 34 years ago. His smoking use included cigarettes. He has a 7.50 pack-year smoking history. He has never used smokeless tobacco.  He reports current drug use. Drug: Marijuana. He reports that he does not drink alcohol.  REVIEW OF SYSTEMS: Review of Systems  Cardiovascular:  Positive for dyspnea on exertion. Negative for chest pain, leg swelling, orthopnea, palpitations, paroxysmal nocturnal dyspnea and syncope.  Respiratory:  Negative for cough and shortness of breath.     PHYSICAL EXAM:    09/04/2022   10:00 AM 03/04/2022    1:44 PM 12/24/2021   10:21 AM  Vitals with BMI  Height _0  _1  _2   Weight 218 lbs 207 lbs 216 lbs  BMI 34.14 77.82 42.35   Systolic 361 443 154  Diastolic 82 84 80  Pulse 69 61 75   Physical Exam  Constitutional: No distress.  Age appropriate, hemodynamically stable.   Neck: No JVD present.  Cardiovascular: Normal rate, regular rhythm, S1 normal, S2 normal and intact distal pulses. Exam reveals no gallop, no S3 and no S4.  No murmur heard. Pulses:      Radial pulses are 2+ on the right side and 2+ on the left side.       Dorsalis pedis pulses are 2+ on the right side and 2+ on the left side.       Posterior tibial pulses are 0 on the right side and 1+ on the left side.  Pulmonary/Chest: Effort normal. No stridor. He has no rales. He has scattered wheezes.  Abdominal: Soft. Bowel sounds are normal. He exhibits no distension. There is no abdominal tenderness.  Obese  Musculoskeletal:        General: No edema.     Cervical back: Neck supple.  Neurological: He is alert and oriented to person, place, and time. He has intact cranial nerves (2-12).  Skin: Skin is warm and moist.   CARDIAC DATABASE: EKG: 09/04/2022: Normal sinus rhythm, 77 bpm, minimal T wave inversions in inferolateral leads consider ischemia, without underlying injury pattern.  Echocardiogram: 08/26/2022: Left ventricle cavity is normal in size. Mild basal septal hypertrophy. Normal global wall motion. Normal LV systolic function with EF 56%. Normal diastolic filling pattern. Calculated EF 56%. Mild mitral valve leaflet thickening. Mild (Grade I) mitral regurgitation. Small circumferential pericardial effusion or pericardial fat.  Normal right atrial pressure.    Stress Testing: Exercise nuclear stress test 09/01/2022: There is a reversible moderate defect in the lateral, inferior and apical regions.  Overall LV systolic function is abnormal with inferior and inferolateral hypokinesis. Stress LV EF: 49%.  Normal ECG stress. The blood pressure response was normal. The patient exercised for 4 minutes and 20 seconds of a Bruce protocol,  achieving approximately 6.25 METs &  91% of MPHR.  No chest pain. Stress terminated due to dyspnea.  No previous exam available for comparison. Intermediate risk study.    Heart Catheterization: None  CAC Scoring  01/21/2022: 1. Coronary calcium score of 57 is at the 33rd percentile for the patient's age, sex and race. 2. Large hiatal hernia. The distal esophagus just proximal to the herniated stomach appears circumferentially thickened. This is nonspecific and may reflect inflammation from chronic reflux. Correlation suggested with any clinical symptoms or prior endoscopy.  LABORATORY DATA:    Latest Ref Rng & Units 03/12/2017    2:30 PM 08/19/2016    2:38 PM 02/21/2014    9:43 AM  CBC  WBC 4.0 - 10.5 K/uL 10.9  9.8  10.9   Hemoglobin 13.0 - 17.0 g/dL 14.3  15.1  17.0   Hematocrit 39.0 - 52.0 % 40.9  44.6  49.5   Platelets 150 - 400 K/uL 235  279  251.0        Latest Ref Rng & Units 09/02/2022    2:43 PM 08/22/2022    2:59 PM 02/05/2022   10:00 AM  CMP  Glucose 70 - 99 mg/dL  82  117   BUN 8 - 27 mg/dL  15  17   Creatinine 0.76 - 1.27 mg/dL  1.26  1.46   Sodium 134 - 144 mmol/L  142  142   Potassium 3.5 - 5.2 mmol/L  4.2  5.4   Chloride 96 - 106 mmol/L  102  102   CO2 20 - 29 mmol/L  25  21   Calcium 8.6 - 10.2 mg/dL  9.3  9.9   Total Protein 6.0 - 8.5 g/dL 7.3   7.5   Total Bilirubin 0.0 - 1.2 mg/dL 0.4   0.5   Alkaline Phos 44 - 121 IU/L 96   114   AST 0 - 40 IU/L 19   18   ALT 0 - 44 IU/L 8   6     Lipid Panel  Lab Results  Component Value Date   CHOL 217 (H) 09/02/2022   HDL 43 09/02/2022   LDLCALC 149 (H) 09/02/2022   LDLDIRECT 154 (H) 09/02/2022   TRIG 140 09/02/2022   CHOLHDL 7.7 (H) 08/19/2016     No components found for: "NTPROBNP" No results for input(s): "PROBNP" in the last 8760 hours. No results for input(s): "TSH" in the last 8760 hours.  BMP Recent Labs    01/02/22 1058 02/05/22 1000 08/22/22 1459  NA 144 142 142  K 4.7 5.4* 4.2  CL 105  102 102  CO2 _0 GLUCOSE 97 117* 82  BUN _1 CREATININE 1.25 1.46* 1.26  CALCIUM 9.5 9.9 9.3    HEMOGLOBIN A1C Lab Results  Component Value Date   HGBA1C 5.1 08/19/2016   MPG 100 08/19/2016   External Labs: Collected: 01/02/2022   CHOL 315 (H) 01/02/2022    HDL 38 (L) 01/02/2022    LDLCALC 247 (H) 01/02/2022    LDLDIRECT 227 (H) 01/02/2022    TRIG 154 (H) 01/02/2022   Collected: 12/02/2021 provided by PCP. Hemoglobin 15.1 g/dL, hematocrit 44.1%. BUN 22, creatinine 1.62. eGFR 45. Sodium 138, potassium 4.6, chloride 104, bicarb 22. AST 15, ALT 7, alkaline phosphatase 100 (within normal limits).  Collected: 04/23/2021. Total cholesterol 279, triglycerides 173, HDL 35, calculated LDL 211.  Collected: 05/04/2020 Total cholesterol 341, triglycerides 173, HDL 44, LDL 263 A1c 5.4  IMPRESSION:    ICD-10-CM   1. Abnormal nuclear stress test  R94.39 CBC    Basic metabolic panel    nitroGLYCERIN (NITROSTAT) 0.4 MG SL tablet    2. Dyspnea on exertion  R06.09 nitroGLYCERIN (NITROSTAT) 0.4 MG SL tablet    3. Agatston coronary artery calcium score less than 100  R93.1 EKG 12-Lead    REPATHA SURECLICK 562 MG/ML SOAJ    4. Pure hypercholesterolemia  Z30.86 REPATHA SURECLICK 578 MG/ML SOAJ    5. Statin intolerance  I69.6 REPATHA SURECLICK 295 MG/ML SOAJ    6. Marijuana abuse  F12.10        RECOMMENDATIONS: [name] Haney is a 71 y.o. Caucasian male whose past medical history and cardiac risk factors include: HLD, hypothyroidism, former smoker, smoking marijuana (everyday).  Abnormal nuclear stress test / Dyspnea on exertion / agatston coronary artery calcium score less than 100  Given his dyspnea on exertion, CAC , and uncontrolled hyperlipidemia the shared decision was to proceed with stress test. The MPI notes reversible myocardial perfusion defect involving the inferior and inferolateral segments. Recommended coronary CTA to further evaluate for obstructive CAD  versus angiography.  After discussing the risks, benefits, alternatives, and limitations patient prefers to proceed with left heart catheterization with possible intervention. Restart aspirin 81 mg p.o. daily. We will prescribe as needed sublingual nitroglycerin tablet.  Patient is not on phosphodiesterase 5 inhibitors and is made aware of the drug to drug interactions. Refill Repatha.  Reemphasized the importance of taking it regularly  The procedure of left heart catheterization with possible intervention was explained to the patient in detail.  The indication, alternatives, risks and benefits were reviewed.  Complications include but not limited to bleeding, infection, vascular injury, stroke, myocardial infarction, arrhythmia (requiring medical or cardiopulmonary resuscitation), kidney injury (requiring short-term or long-term hemodialysis), radiation-related injury in the case of prolonged fluoroscopy use, emergent cardiac surgery, temporary or permanent pacemaker, and death. The patient understands the risks of serious complication is 1-2 in 1000 with diagnostic cardiac cath and 1-2% or less with angioplasty/stenting.  The patient voices understanding and provides verbal feedback his questions and concerns are addressed to his satisfaction and patient wishes to proceed with coronary angiography with possible PCI.   Pure hypercholesterolemia / Statin intolerance In June 2021 his LDL levels were as high as 263 mg/dL. Has been intolerant to statin medications in the past including Crestor. Was prescribed PCSK9 inhibitors which he takes but not in a timely fashion - " stretches it  out." Most recent lipid profile notes improvement in LDL but the LDL is not well controlled. Refill Repatha. Patient is encouraged to use a reminder on his smart phone to help with medication compliance.  Marijuana abuse Educated on importance of complete cessation of marijuana use.  Educated on seeking medical  attention sooner by going to the closest ER via EMS if the symptoms increase in intensity, frequency, duration, or has typical chest pain as discussed in the office.  Patient verbalized understanding.  FINAL MEDICATION LIST END OF ENCOUNTER: Meds ordered this encounter  Medications   REPATHA SURECLICK 140 MG/ML SOAJ    Sig: Inject 140 mg into the skin every 14 (fourteen) days for 8 doses.    Dispense:  2 mL    Refill:  3   nitroGLYCERIN (NITROSTAT) 0.4 MG SL tablet    Sig: Place 1 tablet (0.4 mg total) under the tongue every 5 (five) minutes as needed for chest pain.    Dispense:  30 tablet    Refill:  0     Medications Discontinued During This Encounter  Medication Reason   levothyroxine (SYNTHROID) 100 MCG tablet    rosuvastatin (CRESTOR) 10 MG tablet Patient Preference   REPATHA SURECLICK 140 MG/ML SOAJ Reorder     Current Outpatient Medications:    gabapentin (NEURONTIN) 800 MG tablet, Take 800 mg by mouth at bedtime., Disp: , Rfl:    nitroGLYCERIN (NITROSTAT) 0.4 MG SL tablet, Place 1 tablet (0.4 mg total) under the tongue every 5 (five) minutes as needed for chest pain., Disp: 30 tablet, Rfl: 0   aspirin EC 81 MG tablet, Take 1 tablet (81 mg total) by mouth daily. Swallow whole. (Patient not taking: Reported on 09/04/2022), Disp: 30 tablet, Rfl: 11   REPATHA SURECLICK 140 MG/ML SOAJ, Inject 140 mg into the skin every 14 (fourteen) days for 8 doses., Disp: 2 mL, Rfl:   3  Orders Placed This Encounter  Procedures   CBC   Basic metabolic panel   EKG 27-POEU    There are no Patient Instructions on file for this visit.   --Continue cardiac medications as reconciled in final medication list. --Return in about 9 weeks (around 11/06/2022) for Follow up, Post heart catheterization. Or sooner if needed. --Continue follow-up with your primary care physician regarding the management of your other chronic comorbid conditions.  Patient's questions and concerns were addressed to his  satisfaction. He voices understanding of the instructions provided during this encounter.   This note was created using a voice recognition software as a result there may be grammatical errors inadvertently enclosed that do not reflect the nature of this encounter. Every attempt is made to correct such errors.  Rex Kras, [place], [facility]  Pager: [phone] Office: [phone]

## 2022-09-11 ENCOUNTER — Other Ambulatory Visit: Payer: Self-pay

## 2022-09-11 DIAGNOSIS — E7800 Pure hypercholesterolemia, unspecified: Secondary | ICD-10-CM

## 2022-09-11 DIAGNOSIS — R931 Abnormal findings on diagnostic imaging of heart and coronary circulation: Secondary | ICD-10-CM

## 2022-09-11 DIAGNOSIS — Z789 Other specified health status: Secondary | ICD-10-CM

## 2022-09-11 MED ORDER — REPATHA SURECLICK 140 MG/ML ~~LOC~~ SOAJ: SUBCUTANEOUS | 3 refills | Status: DC

## 2022-09-12 ENCOUNTER — Other Ambulatory Visit: Payer: Self-pay

## 2022-09-12 DIAGNOSIS — E7800 Pure hypercholesterolemia, unspecified: Secondary | ICD-10-CM

## 2022-09-12 DIAGNOSIS — Z789 Other specified health status: Secondary | ICD-10-CM

## 2022-09-12 DIAGNOSIS — R931 Abnormal findings on diagnostic imaging of heart and coronary circulation: Secondary | ICD-10-CM

## 2022-09-13 LAB — BASIC METABOLIC PANEL
BUN/Creatinine Ratio: 15 (ref 10–24)
BUN: 20 mg/dL (ref 8–27)
CO2: 24 mmol/L (ref 20–29)
Calcium: 9.2 mg/dL (ref 8.6–10.2)
Chloride: 103 mmol/L (ref 96–106)
Creatinine, Ser: 1.33 mg/dL — ABNORMAL HIGH (ref 0.76–1.27)
Glucose: 97 mg/dL (ref 70–99)
Potassium: 4.4 mmol/L (ref 3.5–5.2)
Sodium: 142 mmol/L (ref 134–144)
eGFR: 57 mL/min/{1.73_m2} — ABNORMAL LOW (ref >59)

## 2022-09-13 LAB — CBC
Hematocrit: 44.2 % (ref 37.5–51.0)
Hemoglobin: 14.6 g/dL (ref 13.0–17.7)
MCH: 29.4 pg (ref 26.6–33.0)
MCHC: 33 g/dL (ref 31.5–35.7)
MCV: 89 fL (ref 79–97)
Platelets: 300 10*3/uL (ref 150–450)
RBC: 4.96 x10E6/uL (ref 4.14–5.80)
RDW: 13.7 % (ref 11.6–15.4)
WBC: 9.2 10*3/uL (ref 3.4–10.8)

## 2022-09-16 ENCOUNTER — Ambulatory Visit (HOSPITAL_COMMUNITY)
Admission: RE | Disposition: A | Discharge status: Home / Self Care | Payer: Self-pay | Source: Home / Self Care | Attending: Cardiology

## 2022-09-16 ENCOUNTER — Other Ambulatory Visit (HOSPITAL_COMMUNITY): Payer: Self-pay

## 2022-09-16 ENCOUNTER — Ambulatory Visit (HOSPITAL_COMMUNITY)
Admission: RE | Admit: 2022-09-16 | Discharge: 2022-09-16 | Disposition: A | Discharge status: Home / Self Care | Payer: Medicare Other | Attending: Cardiology | Admitting: Cardiology

## 2022-09-16 ENCOUNTER — Other Ambulatory Visit: Payer: Self-pay

## 2022-09-16 DIAGNOSIS — Z9861 Coronary angioplasty status: Secondary | ICD-10-CM

## 2022-09-16 DIAGNOSIS — E7800 Pure hypercholesterolemia, unspecified: Secondary | ICD-10-CM | POA: Diagnosis not present

## 2022-09-16 DIAGNOSIS — F1210 Cannabis abuse, uncomplicated: Secondary | ICD-10-CM | POA: Diagnosis not present

## 2022-09-16 DIAGNOSIS — Z79899 Other long term (current) drug therapy: Secondary | ICD-10-CM | POA: Insufficient documentation

## 2022-09-16 DIAGNOSIS — I2510 Atherosclerotic heart disease of native coronary artery without angina pectoris: Secondary | ICD-10-CM | POA: Insufficient documentation

## 2022-09-16 DIAGNOSIS — Z87891 Personal history of nicotine dependence: Secondary | ICD-10-CM | POA: Diagnosis not present

## 2022-09-16 DIAGNOSIS — E039 Hypothyroidism, unspecified: Secondary | ICD-10-CM | POA: Insufficient documentation

## 2022-09-16 DIAGNOSIS — R0602 Shortness of breath: Secondary | ICD-10-CM | POA: Diagnosis not present

## 2022-09-16 DIAGNOSIS — I25118 Atherosclerotic heart disease of native coronary artery with other forms of angina pectoris: Secondary | ICD-10-CM

## 2022-09-16 DIAGNOSIS — R9439 Abnormal result of other cardiovascular function study: Secondary | ICD-10-CM | POA: Diagnosis present

## 2022-09-16 HISTORY — PX: LEFT HEART CATH AND CORONARY ANGIOGRAPHY: CATH118249

## 2022-09-16 LAB — POCT ACTIVATED CLOTTING TIME: Activated Clotting Time: 299 seconds

## 2022-09-16 SURGERY — LEFT HEART CATH AND CORONARY ANGIOGRAPHY
Anesthesia: LOCAL

## 2022-09-16 MED ORDER — SODIUM CHLORIDE 0.9% FLUSH: 3.0000 mL | Freq: Two times a day (BID) | INTRAVENOUS | Status: DC

## 2022-09-16 MED ORDER — VERAPAMIL HCL 2.5 MG/ML IV SOLN
INTRAVENOUS | Status: AC
  Filled 2022-09-16: qty 2

## 2022-09-16 MED ORDER — SODIUM CHLORIDE 0.9% FLUSH: 3.0000 mL | INTRAVENOUS | Status: DC | PRN

## 2022-09-16 MED ORDER — ASPIRIN 81 MG PO CHEW
81.0000 mg | CHEWABLE_TABLET | ORAL | Status: AC
  Administered 2022-09-16: 81 mg via ORAL
  Filled 2022-09-16: qty 1

## 2022-09-16 MED ORDER — HEPARIN (PORCINE) IN NACL 1000-0.9 UT/500ML-% IV SOLN
INTRAVENOUS | Status: AC
  Filled 2022-09-16: qty 1000

## 2022-09-16 MED ORDER — CLOPIDOGREL BISULFATE 75 MG PO TABS: 75.0000 mg | ORAL_TABLET | Freq: Every day | ORAL | 0 refills | Status: DC

## 2022-09-16 MED ORDER — SODIUM CHLORIDE 0.9 % WEIGHT BASED INFUSION: 1.0000 mL/kg/h | INTRAVENOUS | Status: DC

## 2022-09-16 MED ORDER — FENTANYL CITRATE (PF) 100 MCG/2ML IJ SOLN
INTRAMUSCULAR | Status: AC
  Filled 2022-09-16: qty 2

## 2022-09-16 MED ORDER — FENTANYL CITRATE (PF) 100 MCG/2ML IJ SOLN
INTRAMUSCULAR | Status: DC | PRN
  Administered 2022-09-16: 25 ug via INTRAVENOUS

## 2022-09-16 MED ORDER — NITROGLYCERIN 1 MG/10 ML FOR IR/CATH LAB
INTRA_ARTERIAL | Status: DC | PRN
  Administered 2022-09-16: 200 ug via INTRACORONARY

## 2022-09-16 MED ORDER — CLOPIDOGREL BISULFATE 300 MG PO TABS
ORAL_TABLET | ORAL | Status: AC
  Filled 2022-09-16: qty 1

## 2022-09-16 MED ORDER — SODIUM CHLORIDE 0.9 % WEIGHT BASED INFUSION
3.0000 mL/kg/h | INTRAVENOUS | Status: AC
  Administered 2022-09-16: 3 mL/kg/h via INTRAVENOUS

## 2022-09-16 MED ORDER — SODIUM CHLORIDE 0.9 % IV SOLN: 250.0000 mL | INTRAVENOUS | Status: DC | PRN

## 2022-09-16 MED ORDER — SODIUM CHLORIDE 0.9 % WEIGHT BASED INFUSION: 3.0000 mL/kg/h | INTRAVENOUS | Status: DC

## 2022-09-16 MED ORDER — LIDOCAINE HCL (PF) 1 % IJ SOLN
INTRAMUSCULAR | Status: AC
  Filled 2022-09-16: qty 30

## 2022-09-16 MED ORDER — NITROGLYCERIN 1 MG/10 ML FOR IR/CATH LAB
INTRA_ARTERIAL | Status: AC
  Filled 2022-09-16: qty 10

## 2022-09-16 MED ORDER — MIDAZOLAM HCL 2 MG/2ML IJ SOLN
INTRAMUSCULAR | Status: AC
  Filled 2022-09-16: qty 2

## 2022-09-16 MED ORDER — VERAPAMIL HCL 2.5 MG/ML IV SOLN: INTRAVENOUS | Status: DC | PRN

## 2022-09-16 MED ORDER — ACETAMINOPHEN 325 MG PO TABS: 650.0000 mg | ORAL_TABLET | ORAL | Status: DC | PRN

## 2022-09-16 MED ORDER — HEPARIN SODIUM (PORCINE) 1000 UNIT/ML IJ SOLN
INTRAMUSCULAR | Status: DC | PRN
  Administered 2022-09-16: 6000 [IU] via INTRAVENOUS
  Administered 2022-09-16: 4000 [IU] via INTRAVENOUS

## 2022-09-16 MED ORDER — ASPIRIN 81 MG PO CHEW: 81.0000 mg | CHEWABLE_TABLET | ORAL | Status: DC

## 2022-09-16 MED ORDER — CLOPIDOGREL BISULFATE 300 MG PO TABS
ORAL_TABLET | ORAL | Status: DC | PRN
  Administered 2022-09-16: 600 mg via ORAL

## 2022-09-16 MED ORDER — ONDANSETRON HCL 4 MG/2ML IJ SOLN: 4.0000 mg | Freq: Four times a day (QID) | INTRAMUSCULAR | Status: DC | PRN

## 2022-09-16 MED ORDER — METOPROLOL SUCCINATE ER 25 MG PO TB24
25.0000 mg | ORAL_TABLET | Freq: Every day | ORAL | 2 refills | Status: DC
  Filled 2022-09-16: qty 30, 30d supply, fill #0

## 2022-09-16 MED ORDER — CLOPIDOGREL BISULFATE 75 MG PO TABS
75.0000 mg | ORAL_TABLET | Freq: Every day | ORAL | 3 refills | Status: DC
  Filled 2022-09-16: qty 90, 90d supply, fill #0

## 2022-09-16 MED ORDER — DIAZEPAM 5 MG PO TABS
10.0000 mg | ORAL_TABLET | Freq: Once | ORAL | Status: AC
  Administered 2022-09-16: 10 mg via ORAL
  Filled 2022-09-16: qty 2

## 2022-09-16 MED ORDER — IOHEXOL 350 MG/ML SOLN
INTRAVENOUS | Status: DC | PRN
  Administered 2022-09-16: 90 mL via INTRACARDIAC

## 2022-09-16 MED ORDER — HEPARIN SODIUM (PORCINE) 1000 UNIT/ML IJ SOLN
INTRAMUSCULAR | Status: AC
  Filled 2022-09-16: qty 10

## 2022-09-16 MED ORDER — LIDOCAINE HCL (PF) 1 % IJ SOLN
INTRAMUSCULAR | Status: DC | PRN
  Administered 2022-09-16: 2 mL via SUBCUTANEOUS

## 2022-09-16 MED ORDER — MIDAZOLAM HCL 2 MG/2ML IJ SOLN
INTRAMUSCULAR | Status: DC | PRN
  Administered 2022-09-16: 1 mg via INTRAVENOUS

## 2022-09-16 SURGICAL SUPPLY — 15 items
BALLN WOLVERINE 3.00X15 (BALLOONS) ×1
BALLOON WOLVERINE 3.00X15 (BALLOONS) IMPLANT
BAND ZEPHYR COMPRESS 30 LONG (HEMOSTASIS) IMPLANT
CATH OPTITORQUE TIG 4.0 5F (CATHETERS) IMPLANT
CATH VISTA GUIDE 6FR XB3.5 (CATHETERS) IMPLANT
GLIDESHEATH SLEND A-KIT 6F 22G (SHEATH) IMPLANT
GUIDEWIRE INQWIRE 1.5J.035X260 (WIRE) IMPLANT
INQWIRE 1.5J .035X260CM (WIRE) ×1
KIT ENCORE 26 ADVANTAGE (KITS) IMPLANT
KIT HEART LEFT (KITS) ×1 IMPLANT
PACK CARDIAC CATHETERIZATION (CUSTOM PROCEDURE TRAY) ×1 IMPLANT
SHEATH PROBE COVER 6X72 (BAG) IMPLANT
TRANSDUCER W/STOPCOCK (MISCELLANEOUS) ×1 IMPLANT
TUBING CIL FLEX 10 FLL-RA (TUBING) ×1 IMPLANT
WIRE COUGAR XT STRL 190CM (WIRE) IMPLANT

## 2022-09-16 NOTE — Discharge Instructions (Addendum)

## 2022-09-16 NOTE — Interval H&P Note (Signed)
History and Physical Interval Note:  09/16/2022 1:35 PM  JEROMIAH OHALLORAN  has presented today for surgery, with the diagnosis of abnormal stress test.  The various methods of treatment have been discussed with the patient and family. After consideration of risks, benefits and other options for treatment, the patient has consented to  Procedure(s): LEFT HEART CATH AND CORONARY ANGIOGRAPHY (N/A) and possible angioplasty as a surgical intervention.  The patient's history has been reviewed, patient examined, no change in status, stable for surgery.  I have reviewed the patient's chart and labs.  Questions were answered to the patient's satisfaction.    Cath Lab Visit (complete for each Cath Lab visit)  Clinical Evaluation Leading to the Procedure:   ACS: No.  Non-ACS:    Anginal Classification: CCS III  Anti-ischemic medical therapy: No Therapy  Non-Invasive Test Results: Intermediate-risk stress test findings: cardiac mortality 1-3%/year  Prior CABG: No previous CABG  Adrian Prows

## 2022-09-16 NOTE — Progress Notes (Signed)
Spoke with Dr Einar Gip r/t to discharge order-modified per verbal order

## 2022-09-16 NOTE — Progress Notes (Signed)
CARDIAC REHAB PHASE I    Post cath education including antiplatelet therapy importance, site care, heart healthy diet, risk factors, restrictions, exercise guidelines and CRP2 reviewed. All questions and concerns addressed. Will refer to [facility] for CRP2. Plan for home today  1500-1530  Vanessa Barbara, RN BSN 09/16/2022 3:32 PM

## 2022-09-17 ENCOUNTER — Encounter (HOSPITAL_COMMUNITY): Payer: Self-pay | Admitting: Cardiology

## 2022-09-18 MED FILL — Heparin Sod (Porcine)-NaCl IV Soln 1000 Unit/500ML-0.9%: INTRAVENOUS | Qty: 1000 | Status: AC

## 2022-09-30 ENCOUNTER — Ambulatory Visit: Payer: Medicare Other

## 2022-09-30 VITALS — BP 131/70 | HR 59 | Resp 16 | Ht 67.0 in | Wt 215.0 lb

## 2022-09-30 DIAGNOSIS — Z9861 Coronary angioplasty status: Secondary | ICD-10-CM

## 2022-09-30 DIAGNOSIS — E7800 Pure hypercholesterolemia, unspecified: Secondary | ICD-10-CM

## 2022-09-30 DIAGNOSIS — F1210 Cannabis abuse, uncomplicated: Secondary | ICD-10-CM

## 2022-09-30 DIAGNOSIS — R0609 Other forms of dyspnea: Secondary | ICD-10-CM

## 2022-09-30 DIAGNOSIS — Z789 Other specified health status: Secondary | ICD-10-CM

## 2022-09-30 DIAGNOSIS — R9439 Abnormal result of other cardiovascular function study: Secondary | ICD-10-CM

## 2022-09-30 MED ORDER — ASPIRIN 81 MG PO TBEC: 81.0000 mg | DELAYED_RELEASE_TABLET | Freq: Every day | ORAL | 3 refills | Status: AC

## 2022-09-30 NOTE — Progress Notes (Signed)
Date:  09/30/2022   ID:  [name] Haney, DOB April 18, 1951, MRN [id number]  PCP:  Leonard Downing, MD  Cardiologist:  Rex Kras, DO, [facility] (established care 12/24/21 )  Date: 09/30/22 Last Office Visit: 03/04/2022  Chief Complaint  Patient presents with   Coronary Artery Disease   Follow-up    HPI  [name] Haney is a 71 y.o. Caucasian male whose past medical history and cardiovascular risk factors include: Mild coronary artery calcification, pure hypercholesterolemia, hypothyroidism, former smoker, smoking marijuana (everyday).  Initially referred to the practice for evaluation of hyperlipidemia and statin intolerance.  Patient subsequently underwent coronary calcium score which noted mild CAC.  Due to statin intolerance he was requested to be on PCSK9 inhibitor.  He started Repatha; however, he does not take it regularly.  Patient states that he stretches it every 2 to 3 weeks max.  Repeat lipids are not well controlled.  He was unable to tolerate Crestor 10 mg p.o. nightly due to leg cramps.    Today he presents for hospital follow-up.  At previous office visit given his dyspnea on exertion and uncontrolled hyperlipidemia shared decision made to proceed with stress test reversible myocardial perfusion defect in the inferior and inferior lateral segments.  Recommendation was coronary CTA to further evaluate for obstructive coronary artery disease restrictive pressure however patient preferred to proceed with left heart catheterization.  Patient underwent left heart catheterization on 09/16/2022 successful Cutting Balloon angioplasty.  Aspirin 81 mg daily was continued he was started on Plavix 75 mg daily for 6 months.  He has not been taking Aspirin because he lost the bottle and has not picked up the medication.  Overall, he is doing well without any complaints today.  FUNCTIONAL STATUS: No structured exercise program or daily routine.   ALLERGIES: Allergies  Allergen Reactions    Avelox [Moxifloxacin Hcl In Nacl] Nausea And Vomiting    MEDICATION LIST PRIOR TO VISIT: Current Meds  Medication Sig   aspirin EC 81 MG tablet Take 1 tablet (81 mg total) by mouth daily. Swallow whole.   clopidogrel (PLAVIX) 75 MG tablet Take 1 tablet (75 mg total) by mouth daily.   Evolocumab (REPATHA SURECLICK) 888 MG/ML SOAJ INJECT 140 MG INTO THE SKIN EVERY 14 DAYS   gabapentin (NEURONTIN) 800 MG tablet Take 800 mg by mouth at bedtime.   metoprolol succinate (TOPROL-XL) 25 MG 24 hr tablet Take 1 tablet (25 mg total) by mouth daily. Take with or immediately following a meal.   nitroGLYCERIN (NITROSTAT) 0.4 MG SL tablet Place 1 tablet (0.4 mg total) under the tongue every 5 (five) minutes as needed for chest pain.     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   Coronary artery calcification    Depression    GERD (gastroesophageal reflux disease)    Hypothyroidism    Substance abuse ([place])    marijuana   Thyroid disease     PAST SURGICAL HISTORY: Past Surgical History:  Procedure Laterality Date   APPENDECTOMY     LEFT HEART CATH AND CORONARY ANGIOGRAPHY N/A 09/16/2022   Procedure: LEFT HEART CATH AND CORONARY ANGIOGRAPHY;  Surgeon: Adrian Prows, MD;  Location: [facility];  Service: Cardiovascular;  Laterality: N/A;   LESION REMOVAL Right 03/18/2017   Procedure: LESION REMOVAL;  Surgeon: Izora Gala, MD;  Location:  [facility];  Service: ENT;  Laterality: Right;  excision of right ear skin cancer with skin graft   LUMBAR DISC SURGERY     x 2   MEDIAL PARTIAL KNEE REPLACEMENT Right 2009   NECK SURGERY  2006   SKIN SPLIT GRAFT Right 03/18/2017   Procedure: SKIN GRAFT SPLIT THICKNESS/RIGHT EAR;  Surgeon: Izora Gala, MD;  Location: [facility];  Service: ENT;  Laterality: Right;    FAMILY HISTORY: The patient family history includes Arthritis in his mother; Diabetes (age of  onset: 44) in his mother; Heart disease (age of onset: 41) in his mother; Stroke (age of onset: 62) in his father.  SOCIAL HISTORY:  The patient  reports that he quit smoking about 34 years ago. His smoking use included cigarettes. He has a 7.50 pack-year smoking history. He has never used smokeless tobacco. He reports current drug use. Drug: Marijuana. He reports that he does not drink alcohol.  REVIEW OF SYSTEMS: Review of Systems  Cardiovascular:  Negative for chest pain, dyspnea on exertion, leg swelling, orthopnea, palpitations, paroxysmal nocturnal dyspnea and syncope.  Respiratory:  Negative for cough and shortness of breath.     PHYSICAL EXAM:    09/30/2022   10:58 AM 09/16/2022    8:00 PM 09/16/2022    7:00 PM  Vitals with BMI  Height _0     Weight 215 lbs    BMI 69.62    Systolic 952 841 95  Diastolic 70 81 78  Pulse 59 69 68   Physical Exam  Constitutional: No distress.  Age appropriate, hemodynamically stable.   Neck: No JVD present.  Cardiovascular: Normal rate, regular rhythm, S1 normal, S2 normal and intact distal pulses. Exam reveals no gallop, no S3 and no S4.  No murmur heard. Pulses:      Radial pulses are 2+ on the right side and 2+ on the left side.       Dorsalis pedis pulses are 2+ on the right side and 2+ on the left side.       Posterior tibial pulses are 0 on the right side and 1+ on the left side.  Pulmonary/Chest: Effort normal and breath sounds normal. No stridor. He has no wheezes. He has no rales.  Abdominal: Soft. Bowel sounds are normal. He exhibits no distension. There is no abdominal tenderness.  Obese  Musculoskeletal:        General: No edema.     Cervical back: Neck supple.  Neurological: He is alert and oriented to person, place, and time. He has intact cranial nerves (2-12).  Skin: Skin is warm and moist.   CARDIAC DATABASE: EKG: EKG 09/30/2022: Sinus arrhythmia with rate of 92 bpm.  Normal axis.  Nonspecific T wave abnormality.   No evidence of ischemia or underlying injury pattern.  Compared to previous EKG on 09/05/2019, sinus tachycardia replaces normal sinus rhythm otherwise no significant change.  Echocardiogram: 08/26/2022: Left ventricle cavity is normal in size. Mild basal septal hypertrophy. Normal global wall motion. Normal LV systolic function with EF 56%. Normal diastolic filling pattern. Calculated EF 56%. Mild mitral valve leaflet thickening. Mild (Grade I) mitral regurgitation. Small circumferential pericardial effusion or pericardial fat.  Normal right atrial pressure.    Stress Testing: Exercise nuclear stress test 09/01/2022: There is a reversible moderate defect in the lateral, inferior and apical regions.  Overall LV systolic function is abnormal with inferior and inferolateral hypokinesis. Stress LV EF: 49%.  Normal ECG stress. The blood pressure response was  normal. The patient exercised for 4 minutes and 20 seconds of a Bruce protocol, achieving approximately 6.25 METs &  91% of MPHR.  No chest pain. Stress terminated due to dyspnea.  No previous exam available for comparison. Intermediate risk study.    Heart Catheterization: Left Heart Catheterization 09/16/22:  LV: 119/2, EDP 9 mmHg.  Ao: 125/65, mean 91 mmHg.  No pressure gradient across aortic valve. LVEF 55% with no regional wall motion abnormality.  No significant mitral regurgitation. RCA: Dominant.  Gives origin to small to moderate-sized PDA and PL branch.  Normal. LM: Large vessel, smooth and normal. LAD: Large vessel.  Gives origin to 2 large D1 and D2.  No significant disease is evident and LAD appears relatively smooth and normal. LCx: Proximal segment is smooth.  After the origin of a very tiny AV groove circumflex, large OM1 which has secondary branches is evident which is subtotally occluded with TIMI II-III flow.    CAC Scoring  01/21/2022: 1. Coronary calcium score of 57 is at the 33rd percentile for the patient's age, sex  and race. 2. Large hiatal hernia. The distal esophagus just proximal to the herniated stomach appears circumferentially thickened. This is nonspecific and may reflect inflammation from chronic reflux. Correlation suggested with any clinical symptoms or prior endoscopy.  LABORATORY DATA:    Latest Ref Rng & Units 09/12/2022   11:20 AM 03/12/2017    2:30 PM 08/19/2016    2:38 PM  CBC  WBC 3.4 - 10.8 x10E3/uL 9.2  10.9  9.8   Hemoglobin 13.0 - 17.7 g/dL 14.6  14.3  15.1   Hematocrit 37.5 - 51.0 % 44.2  40.9  44.6   Platelets 150 - 450 x10E3/uL 300  235  279        Latest Ref Rng & Units 09/12/2022   11:20 AM 09/02/2022    2:43 PM 08/22/2022    2:59 PM  CMP  Glucose 70 - 99 mg/dL 97   82   BUN 8 - 27 mg/dL 20   15   Creatinine 0.76 - 1.27 mg/dL 1.33   1.26   Sodium 134 - 144 mmol/L 142   142   Potassium 3.5 - 5.2 mmol/L 4.4   4.2   Chloride 96 - 106 mmol/L 103   102   CO2 20 - 29 mmol/L 24   25   Calcium 8.6 - 10.2 mg/dL 9.2   9.3   Total Protein 6.0 - 8.5 g/dL  7.3    Total Bilirubin 0.0 - 1.2 mg/dL  0.4    Alkaline Phos 44 - 121 IU/L  96    AST 0 - 40 IU/L  19    ALT 0 - 44 IU/L  8      Lipid Panel  Lab Results  Component Value Date   CHOL 217 (H) 09/02/2022   HDL 43 09/02/2022   LDLCALC 149 (H) 09/02/2022   LDLDIRECT 154 (H) 09/02/2022   TRIG 140 09/02/2022   CHOLHDL 7.7 (H) 08/19/2016    BMP Recent Labs    02/05/22 1000 08/22/22 1459 09/12/22 1120  NA 142 142 142  K 5.4* 4.2 4.4  CL 102 102 103  CO2 _0 GLUCOSE 117* 82 97  BUN _1 CREATININE 1.46* 1.26 1.33*  CALCIUM 9.9 9.3 9.2   External Labs:  Collected: 12/02/2021 provided by PCP. Hemoglobin 15.1 g/dL, hematocrit 44.1%. BUN 22, creatinine 1.62. eGFR 45. Sodium 138, potassium 4.6, chloride 104, bicarb  22. AST 15, ALT 7, alkaline phosphatase 100 (within normal limits).   IMPRESSION:    ICD-10-CM   1. Post PTCA  Z98.61 EKG 12-Lead    2. Abnormal nuclear stress test  R94.39      3. Dyspnea on exertion  R06.09     4. Pure hypercholesterolemia  E78.00     5. Statin intolerance  Z78.9     6. Marijuana abuse  F12.10        RECOMMENDATIONS: [name] Haney is a 71 y.o. Caucasian male whose past medical history and cardiac risk factors include: HLD, hypothyroidism, former smoker, smoking marijuana (everyday).  Post PTCA/ Abnormal nuclear stress test / Dyspnea on exertion / agatston coronary artery calcium score less than 100 Patient underwent left heart catheterization on 09/16/2022 successful Cutting Balloon angioplasty.  Present on exertion has improved and he has not had any chest pain. He is currently taking Plavix 75 mg daily but has not been taking his aspirin as prescribed. Discussed the importance of dual antiplatelet therapy post left heart catheterization. Refilled aspirin 81 mg daily.  Pure hypercholesterolemia / Statin intolerance In June 2021 his LDL levels were as high as 263 mg/dL. Has been intolerant to statin medications in the past including Crestor. Was prescribed PCSK9 inhibitors which he takes but not in a timely fashion - " stretches it  out." Most recent lipid profile notes improvement in LDL but the LDL is not well controlled. Patient is encouraged to use a reminder on his smart phone to help with medication compliance. Discussed the importance of lifestyle modifications including diet, exercise, and weight loss.  Marijuana abuse Educated on importance of complete cessation of marijuana use.   FINAL MEDICATION LIST END OF ENCOUNTER: Meds ordered this encounter  Medications   aspirin EC 81 MG tablet    Sig: Take 1 tablet (81 mg total) by mouth daily. Swallow whole.    Dispense:  90 tablet    Refill:  3    Order Specific Question:   Supervising Provider    Answer:   Adrian Prows [[id number]]     Medications Discontinued During This Encounter  Medication Reason   aspirin EC 81 MG tablet      Current Outpatient Medications:    aspirin  EC 81 MG tablet, Take 1 tablet (81 mg total) by mouth daily. Swallow whole., Disp: 90 tablet, Rfl: 3   clopidogrel (PLAVIX) 75 MG tablet, Take 1 tablet (75 mg total) by mouth daily., Disp: 90 tablet, Rfl: 3   Evolocumab (REPATHA SURECLICK) 223 MG/ML SOAJ, INJECT 140 MG INTO THE SKIN EVERY 14 DAYS, Disp: 2 mL, Rfl: 3   gabapentin (NEURONTIN) 800 MG tablet, Take 800 mg by mouth at bedtime., Disp: , Rfl:    metoprolol succinate (TOPROL-XL) 25 MG 24 hr tablet, Take 1 tablet (25 mg total) by mouth daily. Take with or immediately following a meal., Disp: 30 tablet, Rfl: 2   nitroGLYCERIN (NITROSTAT) 0.4 MG SL tablet, Place 1 tablet (0.4 mg total) under the tongue every 5 (five) minutes as needed for chest pain., Disp: 30 tablet, Rfl: 0  Orders Placed This Encounter  Procedures   EKG 12-Lead    There are no Patient Instructions on file for this visit.   --Continue cardiac medications as reconciled in final medication list. --Return in about 3 months (around 12/31/2022) for CAD, HTN, HLD. Or sooner if needed. --Continue follow-up with your primary care physician regarding the management of your other chronic comorbid conditions.  Patient's  questions and concerns were addressed to his satisfaction. He voices understanding of the instructions provided during this encounter.   This note was created using a voice recognition software as a result there may be grammatical errors inadvertently enclosed that do not reflect the nature of this encounter. Every attempt is made to correct such errors.    Ernst Spell, [place] Pager: [phone] Office: [phone]

## 2022-10-24 ENCOUNTER — Ambulatory Visit: Payer: Medicare Other | Admitting: Cardiology

## 2022-11-19 ENCOUNTER — Telehealth (HOSPITAL_COMMUNITY): Payer: Self-pay

## 2022-11-19 NOTE — Telephone Encounter (Signed)
Pt is not interested in the cardiac rehab program. Closed referral 

## 2023-01-02 ENCOUNTER — Ambulatory Visit: Payer: Medicare Other | Admitting: Cardiology

## 2023-01-02 ENCOUNTER — Encounter: Payer: Self-pay | Admitting: Cardiology

## 2023-01-02 VITALS — BP 138/77 | HR 64 | Ht 67.0 in | Wt 216.0 lb

## 2023-01-02 DIAGNOSIS — Z9861 Coronary angioplasty status: Secondary | ICD-10-CM

## 2023-01-02 DIAGNOSIS — I2510 Atherosclerotic heart disease of native coronary artery without angina pectoris: Secondary | ICD-10-CM

## 2023-01-02 DIAGNOSIS — R931 Abnormal findings on diagnostic imaging of heart and coronary circulation: Secondary | ICD-10-CM

## 2023-01-02 DIAGNOSIS — Z789 Other specified health status: Secondary | ICD-10-CM

## 2023-01-02 DIAGNOSIS — E7800 Pure hypercholesterolemia, unspecified: Secondary | ICD-10-CM

## 2023-01-02 MED ORDER — CLOPIDOGREL BISULFATE 75 MG PO TABS: 75.0000 mg | ORAL_TABLET | Freq: Every day | ORAL | 1 refills | Status: AC

## 2023-01-02 MED ORDER — METOPROLOL SUCCINATE ER 25 MG PO TB24: 25.0000 mg | ORAL_TABLET | Freq: Every day | ORAL | 1 refills | Status: DC

## 2023-01-02 MED ORDER — REPATHA SURECLICK 140 MG/ML ~~LOC~~ SOAJ: SUBCUTANEOUS | 3 refills | Status: DC

## 2023-01-02 NOTE — Progress Notes (Signed)
Date:  01/02/2023   ID:  [name] Haney, DOB 1951/07/10, MRN [id number]  PCP:  Leonard Downing, MD  Cardiologist:  Rex Kras, DO, [facility] (established care 12/24/21 )  Date: 01/02/23 Last Office Visit: 09/04/2022  Chief Complaint  Patient presents with   Follow-up   Coronary Artery Disease    HPI  [name] Haney is a 72 y.o. Caucasian male whose past medical history and cardiovascular risk factors include: Coronary artery disease status post angioplasty, status post mild coronary artery calcification, pure hypercholesterolemia, hypothyroidism, former smoker, smoking marijuana (everyday).  Referred to the practice for evaluation of hyperlipidemia and statin intolerance.  Given his history and diagnostic evaluation noting CAC he was started on PCSK9 inhibitors.  He eventually underwent stress test which was concerning for reversible ischemia in the inferior and inferolateral segments.  He underwent invasive angiography and successful [place] Balloon angioplasty.  He was placed on dual antiplatelet therapy for 6 months.  He now presents for follow-up.  Since last office visit he has not had any anginal discomfort or heart failure symptoms.  He is taking his Repatha more regularly compared to prior.  He is supposed to be on dual antiplatelet therapy for 6 months; however, stopped taking Plavix 2 days ago as he ran out of prescription.  When asked why he did not call in for refill he did not have a justification.  Reemphasized the duration of dual antiplatelet therapy in the setting of CAD.  He is also requesting refills on his other cardiac medications.  Overall functional capacity remains stable.  FUNCTIONAL STATUS: No structured exercise program or daily routine.   ALLERGIES: Allergies  Allergen Reactions   Avelox [Moxifloxacin Hcl In Nacl] Nausea And Vomiting   Statins Other (See Comments)    Gets weak in legs and collapses     MEDICATION LIST PRIOR TO VISIT: Current Meds   Medication Sig   aspirin EC 81 MG tablet Take 1 tablet (81 mg total) by mouth daily. Swallow whole.   gabapentin (NEURONTIN) 800 MG tablet Take 800 mg by mouth at bedtime.   nitroGLYCERIN (NITROSTAT) 0.4 MG SL tablet Place 1 tablet (0.4 mg total) under the tongue every 5 (five) minutes as needed for chest pain.   [DISCONTINUED] clopidogrel (PLAVIX) 75 MG tablet Take 1 tablet (75 mg total) by mouth daily.   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   Coronary artery calcification    Depression    GERD (gastroesophageal reflux disease)    Hypothyroidism    Substance abuse ([place])    marijuana   Thyroid disease     PAST SURGICAL HISTORY: Past Surgical History:  Procedure Laterality Date   APPENDECTOMY     LEFT HEART CATH AND CORONARY ANGIOGRAPHY N/A 09/16/2022   Procedure: LEFT HEART CATH AND CORONARY ANGIOGRAPHY;  Surgeon: Adrian Prows, MD;  Location: [facility];  Service: Cardiovascular;  Laterality: N/A;   LESION REMOVAL Right 03/18/2017   Procedure: LESION REMOVAL;  Surgeon: Izora Gala, MD;  Location: [facility];  Service: ENT;  Laterality: Right;  excision of right ear skin cancer with skin graft   LUMBAR DISC SURGERY     x 2   MEDIAL PARTIAL KNEE REPLACEMENT  Right 2009   NECK SURGERY  2006   SKIN SPLIT GRAFT Right 03/18/2017   Procedure: SKIN GRAFT SPLIT THICKNESS/RIGHT EAR;  Surgeon: Izora Gala, MD;  Location: [facility];  Service: ENT;  Laterality: Right;    FAMILY HISTORY: The patient family history includes Arthritis in his mother; Diabetes (age of onset: 60) in his mother; Heart disease (age of onset: 4) in his mother; Stroke (age of onset: 60) in his father.  SOCIAL HISTORY:  The patient  reports that he quit smoking about 35 years ago. His smoking use  included cigarettes. He has a 7.50 pack-year smoking history. He has never used smokeless tobacco. He reports current drug use. Drug: Marijuana. He reports that he does not drink alcohol.  REVIEW OF SYSTEMS: Review of Systems  Cardiovascular:  Negative for chest pain, dyspnea on exertion, leg swelling, orthopnea, palpitations, paroxysmal nocturnal dyspnea and syncope.  Respiratory:  Negative for cough and shortness of breath.     PHYSICAL EXAM:    01/02/2023    2:21 PM 09/30/2022   10:58 AM 09/16/2022    8:00 PM  Vitals with BMI  Height 5' 7"$  5' 7"$    Weight 216 lbs 215 lbs   BMI [id number] [id number]   Systolic 0000000 [id number]  Diastolic 77 70 81  Pulse 64 59 69   Physical Exam  Constitutional: No distress.  Age appropriate, hemodynamically stable.   Neck: No JVD present.  Cardiovascular: Normal rate, regular rhythm, S1 normal, S2 normal and intact distal pulses. Exam reveals no gallop, no S3 and no S4.  No murmur heard. Pulses:      Radial pulses are 2+ on the right side and 2+ on the left side.       Dorsalis pedis pulses are 2+ on the right side and 2+ on the left side.       Posterior tibial pulses are 0 on the right side and 1+ on the left side.  Pulmonary/Chest: Effort normal and breath sounds normal. No stridor. He has no wheezes. He has no rales.  Abdominal: Soft. Bowel sounds are normal. He exhibits no distension. There is no abdominal tenderness.  Obese  Musculoskeletal:        General: No edema.     Cervical back: Neck supple.  Neurological: He is alert and oriented to person, place, and time. He has intact cranial nerves (2-12).  Skin: Skin is warm and moist.   CARDIAC DATABASE: EKG: EKG 09/30/2022: Sinus arrhythmia with rate of 92 bpm.  Normal axis.  Nonspecific T wave abnormality.  No evidence of ischemia or underlying injury pattern.  Compared to previous EKG on 09/05/2019, sinus tachycardia replaces normal sinus rhythm otherwise no significant  change.  Echocardiogram: 08/26/2022: Left ventricle cavity is normal in size. Mild basal septal hypertrophy. Normal global wall motion. Normal LV systolic function with EF 56%. Normal diastolic filling pattern. Calculated EF 56%. Mild mitral valve leaflet thickening. Mild (Grade I) mitral regurgitation. Small circumferential pericardial effusion or pericardial fat.  Normal right atrial pressure.    Stress Testing: Exercise nuclear stress test 09/01/2022: There is a reversible moderate defect in the lateral, inferior and apical regions.  Overall LV systolic function is abnormal with inferior and inferolateral hypokinesis. Stress LV EF: 49%.  Normal ECG stress. The blood pressure response was normal. The patient exercised for 4 minutes and 20 seconds of a Bruce protocol, achieving approximately 6.25 METs &  91% of MPHR.  No chest pain. Stress terminated due to dyspnea.  No previous exam available for comparison. Intermediate risk study.    Heart Catheterization: Left Heart Catheterization 09/16/22:  LV: 119/2, EDP 9 mmHg.  Ao: 125/65, mean 91 mmHg.  No pressure gradient across aortic valve. LVEF 55% with no regional wall motion abnormality.  No significant mitral regurgitation. RCA: Dominant.  Gives origin to small to moderate-sized PDA and PL branch.  Normal. LM: Large vessel, smooth and normal. LAD: Large vessel.  Gives origin to 2 large D1 and D2.  No significant disease is evident and LAD appears relatively smooth and normal. LCx: Proximal segment is smooth.  After the origin of a very tiny AV groove circumflex, large OM1 which has secondary branches is evident which is subtotally occluded with TIMI II-III flow.   Recommendation: Patient has been educated regarding unstable angina presentation in view of balloon angioplasty result only.  He will be observed for 6 to 8 hours before discharge today.  He will be on aspirin indefinitely and Plavix for at least a period of 6 months.  90 mL  contrast utilized.  CAC Scoring  01/21/2022: 1. Coronary calcium score of 57 is at the 33rd percentile for the patient's age, sex and race. 2. Large hiatal hernia. The distal esophagus just proximal to the herniated stomach appears circumferentially thickened. This is nonspecific and may reflect inflammation from chronic reflux. Correlation suggested with any clinical symptoms or prior endoscopy.  LABORATORY DATA:    Latest Ref Rng & Units 09/12/2022   11:20 AM 03/12/2017    2:30 PM 08/19/2016    2:38 PM  CBC  WBC 3.4 - 10.8 x10E3/uL 9.2  10.9  9.8   Hemoglobin 13.0 - 17.7 g/dL 14.6  14.3  15.1   Hematocrit 37.5 - 51.0 % 44.2  40.9  44.6   Platelets 150 - 450 x10E3/uL 300  235  279        Latest Ref Rng & Units 09/12/2022   11:20 AM 09/02/2022    2:43 PM 08/22/2022    2:59 PM  CMP  Glucose 70 - 99 mg/dL 97   82   BUN 8 - 27 mg/dL 20   15   Creatinine 0.76 - 1.27 mg/dL 1.33   1.26   Sodium 134 - 144 mmol/L 142   142   Potassium 3.5 - 5.2 mmol/L 4.4   4.2   Chloride 96 - 106 mmol/L 103   102   CO2 20 - 29 mmol/L 24   25   Calcium 8.6 - 10.2 mg/dL 9.2   9.3   Total Protein 6.0 - 8.5 g/dL  7.3    Total Bilirubin 0.0 - 1.2 mg/dL  0.4    Alkaline Phos 44 - 121 IU/L  96    AST 0 - 40 IU/L  19    ALT 0 - 44 IU/L  8      Lipid Panel  Lab Results  Component Value Date   CHOL 217 (H) 09/02/2022   HDL 43 09/02/2022   LDLCALC 149 (H) 09/02/2022   LDLDIRECT 154 (H) 09/02/2022   TRIG 140 09/02/2022   CHOLHDL 7.7 (H) 08/19/2016    BMP Recent Labs    02/05/22 1000 08/22/22 1459 09/12/22 1120  NA 142 142 142  K 5.4* 4.2 4.4  CL 102 102 103  CO2 21 25 24  $ GLUCOSE 117* 82 97  BUN 17 15 20  $ CREATININE 1.46* 1.26 1.33*  CALCIUM 9.9 9.3 9.2   External Labs:  Collected: 12/02/2021 provided by PCP.  Hemoglobin 15.1 g/dL, hematocrit 44.1%. BUN 22, creatinine 1.62. eGFR 45. Sodium 138, potassium 4.6, chloride 104, bicarb 22. AST 15, ALT 7, alkaline phosphatase 100 (within  normal limits).   IMPRESSION:    ICD-10-CM   1. Atherosclerosis of native coronary artery of native heart without angina pectoris  I25.10 clopidogrel (PLAVIX) 75 MG tablet    Evolocumab (REPATHA SURECLICK) XX123456 MG/ML SOAJ    Lipid Panel With LDL/HDL Ratio    LDL cholesterol, direct    CMP14+EGFR    metoprolol succinate (TOPROL-XL) 25 MG 24 hr tablet    2. Post PTCA  Z98.61     3. Agatston coronary artery calcium score less than 100  R93.1 Evolocumab (REPATHA SURECLICK) XX123456 MG/ML SOAJ    4. Pure hypercholesterolemia  E78.00 Evolocumab (REPATHA SURECLICK) XX123456 MG/ML SOAJ    5. Statin intolerance  Z78.9 Evolocumab (REPATHA SURECLICK) XX123456 MG/ML SOAJ       RECOMMENDATIONS: [name] Haney is a 72 y.o. Caucasian male whose past medical history and cardiac risk factors include: Coronary artery disease status post angioplasty, status post mild coronary artery calcification, pure hypercholesterolemia, hypothyroidism, former smoker, smoking marijuana (everyday).  Atherosclerosis of native coronary artery of native heart without angina pectoris Post PTCA Agatston coronary artery calcium score less than 100 Presents today for follow-up. Denies anginal discomfort or heart failure symptoms. No use of sublingual nitroglycerin tablets since the last office visit. Reemphasized importance of dual antiplatelet therapy for total of 6 months-until Mar 18, 2023. Refilled Plavix, metoprolol, Repatha Wanted to discuss considering Ozempic for weight loss management given his underlying CAD and obesity.  However, he was on a phone call during the entire office visit.  Pure hypercholesterolemia Statin intolerance In June 2021 his LDL levels were as high as 263 mg/dL. Has been intolerant to statin medications in the past including Crestor. Now taking PCSK9 inhibitors regularly. Refill provided. Will check fasting lipid profile to reevaluate therapy.  Marijuana abuse Educated on importance of complete  cessation of marijuana use.   FINAL MEDICATION LIST END OF ENCOUNTER: Meds ordered this encounter  Medications   clopidogrel (PLAVIX) 75 MG tablet    Sig: Take 1 tablet (75 mg total) by mouth daily.    Dispense:  90 tablet    Refill:  1   Evolocumab (REPATHA SURECLICK) XX123456 MG/ML SOAJ    Sig: INJECT 140 MG INTO THE SKIN EVERY 14 DAYS    Dispense:  2 mL    Refill:  3    approved through 11/17/2023   metoprolol succinate (TOPROL-XL) 25 MG 24 hr tablet    Sig: Take 1 tablet (25 mg total) by mouth daily. Take with or immediately following a meal.    Dispense:  90 tablet    Refill:  1     Medications Discontinued During This Encounter  Medication Reason   metoprolol succinate (TOPROL-XL) 25 MG 24 hr tablet Patient Preference   Evolocumab (REPATHA SURECLICK) XX123456 MG/ML SOAJ Reorder   clopidogrel (PLAVIX) 75 MG tablet Reorder     Current Outpatient Medications:    aspirin EC 81 MG tablet, Take 1 tablet (81 mg total) by mouth daily. Swallow whole., Disp: 90 tablet, Rfl: 3   gabapentin (NEURONTIN) 800 MG tablet, Take 800 mg by mouth at bedtime., Disp: , Rfl:    nitroGLYCERIN (NITROSTAT) 0.4 MG SL tablet, Place 1 tablet (0.4 mg total) under the tongue every 5 (five) minutes as needed for chest pain., Disp: 30 tablet, Rfl: 0   clopidogrel (PLAVIX) 75  MG tablet, Take 1 tablet (75 mg total) by mouth daily., Disp: 90 tablet, Rfl: 1   Evolocumab (REPATHA SURECLICK) [id number] MG/ML SOAJ, INJECT 140 MG INTO THE SKIN EVERY 14 DAYS, Disp: 2 mL, Rfl: 3   metoprolol succinate (TOPROL-XL) 25 MG 24 hr tablet, Take 1 tablet (25 mg total) by mouth daily. Take with or immediately following a meal., Disp: 90 tablet, Rfl: 1  Orders Placed This Encounter  Procedures   Lipid Panel With LDL/HDL Ratio   LDL cholesterol, direct   CMP14+EGFR    There are no Patient Instructions on file for this visit.   --Continue cardiac medications as reconciled in final medication list. --Return in about 6 months (around  07/03/2023) for Follow up, CAD. Or sooner if needed. --Continue follow-up with your primary care physician regarding the management of your other chronic comorbid conditions.  Patient's questions and concerns were addressed to his satisfaction. He voices understanding of the instructions provided during this encounter.   This note was created using a voice recognition software as a result there may be grammatical errors inadvertently enclosed that do not reflect the nature of this encounter. Every attempt is made to correct such errors.    Ernst Spell, [place] Pager: [phone] Office: [phone]

## 2023-07-03 ENCOUNTER — Ambulatory Visit: Payer: Medicare Other | Admitting: Cardiology

## 2023-07-07 NOTE — Progress Notes (Signed)
Called patient to inform him about his labs results. Patient mention that he is taking his PCSK9 inhibitors regularly.

## 2023-07-14 NOTE — Progress Notes (Signed)
No action done

## 2023-07-22 NOTE — Telephone Encounter (Signed)
Patient called the office because per Dr. Odis Hollingshead patient was instructed to call when he got his new prescription of repatha to get scheduled for a nurse visit to be taught how to administer medication patient was transferred to front desk to get scheduled.

## 2023-07-28 ENCOUNTER — Ambulatory Visit: Payer: Medicare Other | Admitting: Cardiology

## 2023-07-28 ENCOUNTER — Encounter: Payer: Self-pay | Admitting: Cardiology

## 2023-07-28 VITALS — BP 124/64 | HR 55 | Resp 16 | Ht 67.0 in | Wt 216.0 lb

## 2023-07-28 DIAGNOSIS — Z789 Other specified health status: Secondary | ICD-10-CM

## 2023-07-28 DIAGNOSIS — Z9861 Coronary angioplasty status: Secondary | ICD-10-CM

## 2023-07-28 DIAGNOSIS — F1210 Cannabis abuse, uncomplicated: Secondary | ICD-10-CM

## 2023-07-28 DIAGNOSIS — I2510 Atherosclerotic heart disease of native coronary artery without angina pectoris: Secondary | ICD-10-CM

## 2023-07-28 DIAGNOSIS — E7800 Pure hypercholesterolemia, unspecified: Secondary | ICD-10-CM

## 2023-07-28 DIAGNOSIS — R931 Abnormal findings on diagnostic imaging of heart and coronary circulation: Secondary | ICD-10-CM

## 2023-07-28 MED ORDER — NEXLIZET 180-10 MG PO TABS: 1.0000 | ORAL_TABLET | Freq: Every day | ORAL | 0 refills | Status: AC

## 2023-07-28 NOTE — Progress Notes (Signed)
Date:  07/28/2023   ID:  [name] Haney, DOB [birth year], MRN [id number]  PCP:  [name] Mask, MD  Cardiologist:  [name] Lerner, DO, [facility] (established care 12/24/21 )  Date: 07/28/23 Last Office Visit: 01/02/2023  Chief Complaint  Patient presents with   Coronary Artery Disease   Follow-up    6 month    HPI  [name] Haney is a 72 y.o. Caucasian male whose past medical history and cardiovascular risk factors include: Coronary artery disease status post angioplasty, status post mild coronary artery calcification, pure hypercholesterolemia, hypothyroidism, former smoker, smoking marijuana (everyday).  Patient is being followed by the practice for his coronary artery disease and hyperlipidemia.  In the past patient had a stress test revealing reversible ischemia in the inferior and inferior lateral segments.  He underwent invasive angiography and successful Cut[place] Balloon angioplasty and was placed on antiplatelet therapy and given his statin intolerance was on PCSK9 inhibitor.  He presents today for 61-month follow-up visit.  Denies anginal chest pain or heart failure symptoms.  Patient states that he tries his best to take medications on a regular basis but at times he does "stretch it out."  Despite being on PCSK9 inhibitors LDL is currently not at goal.  Component that could be noncompliance.  In the past Crestor 10 mg p.o. nightly caused myalgias.  FUNCTIONAL STATUS: No structured exercise program or daily routine.   ALLERGIES: Allergies  Allergen Reactions   Avelox [Moxifloxacin Hcl In Nacl] Nausea And Vomiting   Statins Other (See Comments)    Gets weak in legs and collapses     MEDICATION LIST PRIOR TO VISIT: Current Meds  Medication Sig   aspirin EC 81 MG tablet Take 1 tablet (81 mg total) by mouth daily. Swallow whole.   Bempedoic Acid-Ezetimibe (NEXLIZET) 180-10 MG TABS Take 1 tablet by mouth daily.   Evolocumab (REPATHA SURECLICK) 140 MG/ML SOAJ INJECT 140  MG INTO THE SKIN EVERY 14 DAYS   FLUoxetine (PROZAC) 20 MG capsule Take 20 mg by mouth daily.   gabapentin (NEURONTIN) 800 MG tablet Take 800 mg by mouth at bedtime.   memantine (NAMENDA) 5 MG tablet Take 5 mg by mouth daily.     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   Coronary artery calcification    Depression    GERD (gastroesophageal reflux disease)    Hypothyroidism    Substance abuse (HCC)    marijuana   Thyroid disease     PAST SURGICAL HISTORY: Past Surgical History:  Procedure Laterality Date   APPENDECTOMY     LEFT HEART CATH AND CORONARY ANGIOGRAPHY N/A 09/16/2022   Procedure: LEFT HEART CATH AND CORONARY ANGIOGRAPHY;  Surgeon: [name] Decamp, MD;  Location: [facility] IN[facility];  Service: Cardiovascular;  Laterality: N/A;   LESION REMOVAL Right 03/18/2017   Procedure: LESION REMOVAL;  Surgeon: [name] Colonel, MD;  Location: [facility] OR;  Service: ENT;  Laterality: Right;  excision of right ear skin cancer with skin graft   LUMBAR DISC SURGERY     x 2   MEDIAL PARTIAL KNEE REPLACEMENT Right 2009   NECK SURGERY  2006   SKIN SPLIT GRAFT Right 03/18/2017   Procedure: SKIN GRAFT SPLIT THICKNESS/RIGHT EAR;  Surgeon: [name] Colonel, MD;  Location: [facility] OR;  Service: ENT;  Laterality: Right;    FAMILY HISTORY:  The patient family history includes Arthritis in his mother; Diabetes (age of onset: 75) in his mother; Heart disease (age of onset: 71) in his mother; Stroke (age of onset: 59) in his father.  SOCIAL HISTORY:  The patient  reports that he quit smoking about 35 years ago. His smoking use included cigarettes. He started smoking about 50 years ago. He has a 7.5 pack-year smoking history. He has never used smokeless tobacco. He reports current drug use. Drug: Marijuana. He reports that he does not drink alcohol.  REVIEW OF SYSTEMS: Review of Systems  Cardiovascular:  Negative for  chest pain, dyspnea on exertion, leg swelling, orthopnea, palpitations, paroxysmal nocturnal dyspnea and syncope.  Respiratory:  Negative for cough and shortness of breath.     PHYSICAL EXAM:    07/28/2023    1:35 PM 01/02/2023    2:21 PM 09/30/2022   10:58 AM  Vitals with BMI  Height 5\' 7"  5\' 7"  5\' 7"   Weight 216 lbs 216 lbs 215 lbs  BMI 33.82 33.82 33.67  Systolic 124 138 161  Diastolic 64 77 70  Pulse 55 64 59   Physical Exam  Constitutional: No distress.  Age appropriate, hemodynamically stable.   Neck: No JVD present.  Cardiovascular: Normal rate, regular rhythm, S1 normal, S2 normal and intact distal pulses. Exam reveals no gallop, no S3 and no S4.  No murmur heard. Pulses:      Radial pulses are 2+ on the right side and 2+ on the left side.       Dorsalis pedis pulses are 2+ on the right side and 2+ on the left side.       Posterior tibial pulses are 0 on the right side and 1+ on the left side.  Pulmonary/Chest: Effort normal. No stridor. He has no rales. He has diffuse wheezes. Bilateral  Abdominal: Soft. Bowel sounds are normal. He exhibits no distension. There is no abdominal tenderness.  Obese  Musculoskeletal:        General: No edema.     Cervical back: Neck supple.  Neurological: He is alert and oriented to person, place, and time. He has intact cranial nerves (2-12).  Skin: Skin is warm and moist.   CARDIAC DATABASE: EKG: 07/28/2023: Sinus rhythm, PACs, 63 bpm, without underlying injury pattern  Echocardiogram: 08/26/2022: Left ventricle cavity is normal in size. Mild basal septal hypertrophy. Normal global wall motion. Normal LV systolic function with EF 56%. Normal diastolic filling pattern. Calculated EF 56%. Mild mitral valve leaflet thickening. Mild (Grade I) mitral regurgitation. Small circumferential pericardial effusion or pericardial fat.  Normal right atrial pressure.    Stress Testing: Exercise nuclear stress test 09/01/2022: There is a  reversible moderate defect in the lateral, inferior and apical regions.  Overall LV systolic function is abnormal with inferior and inferolateral hypokinesis. Stress LV EF: 49%.  Normal ECG stress. The blood pressure response was normal. The patient exercised for 4 minutes and 20 seconds of a Bruce protocol, achieving approximately 6.25 METs &  91% of MPHR.  No chest pain. Stress terminated due to dyspnea.  No previous exam available for comparison. Intermediate risk study.    Heart Catheterization: Left Heart Catheterization 09/16/22:  LV: 119/2, EDP 9 mmHg.  Ao: 125/65, mean 91 mmHg.  No pressure gradient across aortic valve. LVEF 55% with no regional wall motion abnormality.  No significant mitral regurgitation. RCA: Dominant.  Gives origin to small to moderate-sized PDA and PL branch.  Normal. LM: Large vessel, smooth and  normal. LAD: Large vessel.  Gives origin to 2 large D1 and D2.  No significant disease is evident and LAD appears relatively smooth and normal. LCx: Proximal segment is smooth.  After the origin of a very tiny AV groove circumflex, large OM1 which has secondary branches is evident which is subtotally occluded with TIMI II-III flow.   Recommendation: Patient has been educated regarding unstable angina presentation in view of balloon angioplasty result only.  He will be observed for 6 to 8 hours before discharge today.  He will be on aspirin indefinitely and Plavix for at least a period of 6 months.  90 mL contrast utilized.  CAC Scoring  01/21/2022: 1. Coronary calcium score of 57 is at the 33rd percentile for the patient's age, sex and race. 2. Large hiatal hernia. The distal esophagus just proximal to the herniated stomach appears circumferentially thickened. This is nonspecific and may reflect inflammation from chronic reflux. Correlation suggested with any clinical symptoms or prior endoscopy.  LABORATORY DATA:    Latest Ref Rng & Units 09/12/2022   11:20 AM  03/12/2017    2:30 PM 08/19/2016    2:38 PM  CBC  WBC 3.4 - 10.8 x10E3/uL 9.2  10.9  9.8   Hemoglobin 13.0 - 17.7 g/dL 16.1  09.6  04.5   Hematocrit 37.5 - 51.0 % 44.2  40.9  44.6   Platelets 150 - 450 x10E3/uL 300  235  279        Latest Ref Rng & Units 06/30/2023   11:36 AM 09/12/2022   11:20 AM 09/02/2022    2:43 PM  CMP  Glucose 70 - 99 mg/dL 94  97    BUN 8 - 27 mg/dL 18  20    Creatinine 4.09 - 1.27 mg/dL 8.11  9.14    Sodium 782 - 144 mmol/L 140  142    Potassium 3.5 - 5.2 mmol/L 4.3  4.4    Chloride 96 - 106 mmol/L 102  103    CO2 20 - 29 mmol/L 23  24    Calcium 8.6 - 10.2 mg/dL 9.2  9.2    Total Protein 6.0 - 8.5 g/dL 6.9   7.3   Total Bilirubin 0.0 - 1.2 mg/dL 0.3   0.4   Alkaline Phos 44 - 121 IU/L 91   96   AST 0 - 40 IU/L 13   19   ALT 0 - 44 IU/L 6   8     Lipid Panel  Lab Results  Component Value Date   CHOL 205 (H) 06/30/2023   HDL 40 06/30/2023   LDLCALC 143 (H) 06/30/2023   LDLDIRECT 139 (H) 06/30/2023   TRIG 119 06/30/2023   CHOLHDL 7.7 (H) 08/19/2016    BMP Recent Labs    08/22/22 1459 09/12/22 1120 06/30/23 1136  NA 142 142 140  K 4.2 4.4 4.3  CL 102 103 102  CO2 25 24 23   GLUCOSE 82 97 94  BUN 15 20 18   CREATININE 1.26 1.33* 1.32*  CALCIUM 9.3 9.2 9.2   External Labs:  Collected: 12/02/2021 provided by PCP. Hemoglobin 15.1 g/dL, hematocrit 95.6%. BUN 22, creatinine 1.62. eGFR 45. Sodium 138, potassium 4.6, chloride 104, bicarb 22. AST 15, ALT 7, alkaline phosphatase 100 (within normal limits).  External Labs: Collected: 07/10/2023 provided by PCP. Hemoglobin 12.6. BUN 18, creatinine 1.28. eGFR 59. Sodium 142, potassium 4.3, chloride 105, bicarb 24. AST 15, ALT 7, alkaline phosphatase 87. Total cholesterol 154, triglycerides 106, HDL  39, LDL calculated 95 A1c 5.4. TSH 5.76  LDL-C: February 2023 247 mg/dL. March 2023 128 mg/dL. October 2023 149 mg/dL June 30 2023 528 mg/dL   IMPRESSION:    UXL-[id number]   1.  Atherosclerosis of native coronary artery of native heart without angina pectoris  I25.10 EKG 12-Lead    Bempedoic Acid-Ezetimibe (NEXLIZET) 180-10 MG TABS    Lipid Panel With LDL/HDL Ratio    LDL cholesterol, direct    CMP14+EGFR    2. Post PTCA  Z98.61 Bempedoic Acid-Ezetimibe (NEXLIZET) 180-10 MG TABS    Lipid Panel With LDL/HDL Ratio    LDL cholesterol, direct    CMP14+EGFR    3. Agatston coronary artery calcium score less than 100  R93.1 Bempedoic Acid-Ezetimibe (NEXLIZET) 180-10 MG TABS    Lipid Panel With LDL/HDL Ratio    LDL cholesterol, direct    CMP14+EGFR    4. Pure hypercholesterolemia  E78.00 Bempedoic Acid-Ezetimibe (NEXLIZET) 180-10 MG TABS    Lipid Panel With LDL/HDL Ratio    LDL cholesterol, direct    CMP14+EGFR    5. Statin intolerance  Z78.9 Bempedoic Acid-Ezetimibe (NEXLIZET) 180-10 MG TABS    Lipid Panel With LDL/HDL Ratio    LDL cholesterol, direct    CMP14+EGFR    6. Marijuana abuse  F12.10 Bempedoic Acid-Ezetimibe (NEXLIZET) 180-10 MG TABS       RECOMMENDATIONS: BILAL KALLER is a 72 y.o. Caucasian male whose past medical history and cardiac risk factors include: Coronary artery disease status post angioplasty, status post mild coronary artery calcification, pure hypercholesterolemia, hypothyroidism, former smoker, smoking marijuana (everyday).  Atherosclerosis of native coronary artery of native heart without angina pectoris Post PTCA Agatston coronary artery calcium score less than 100 No chest pain or anginal discomfort since last office visit. Medications reconciled. No use of sublingual nitroglycerin tablets since the last office visit. Reemphasized the importance of secondary prevention with focus on improving her modifiable cardiovascular risk factors such as glycemic control, lipid management, blood pressure control, weight loss. Re emphasize importance of medication compliance  Pure hypercholesterolemia Statin intolerance In June 2021 his  LDL levels were as high as 263 mg/dL. As of 06/30/23 direct LDL of 139 mg/dL and outside labs from 11/19/7251 calculated LDL 95 mg/dL I suspect that he is not taking his PCSK9 inhibitors regularly -as he likes to stretch his medications out. Was on Crestor in the past which caused myalgias. Will start Nexlizet-medication profile discussed with the patient.  Recheck lipids in 6 weeks after starting therapy.  Marijuana abuse Reemphasized importance of complete cessation of marijuana products  FINAL MEDICATION LIST END OF ENCOUNTER: Meds ordered this encounter  Medications   Bempedoic Acid-Ezetimibe (NEXLIZET) 180-10 MG TABS    Sig: Take 1 tablet by mouth daily.    Dispense:  90 tablet    Refill:  0     There are no discontinued medications.    Current Outpatient Medications:    aspirin EC 81 MG tablet, Take 1 tablet (81 mg total) by mouth daily. Swallow whole., Disp: 90 tablet, Rfl: 3   Bempedoic Acid-Ezetimibe (NEXLIZET) 180-10 MG TABS, Take 1 tablet by mouth daily., Disp: 90 tablet, Rfl: 0   Evolocumab (REPATHA SURECLICK) 140 MG/ML SOAJ, INJECT 140 MG INTO THE SKIN EVERY 14 DAYS, Disp: 2 mL, Rfl: 3   FLUoxetine (PROZAC) 20 MG capsule, Take 20 mg by mouth daily., Disp: , Rfl:    gabapentin (NEURONTIN) 800 MG tablet, Take 800 mg by mouth at bedtime., Disp: , Rfl:  memantine (NAMENDA) 5 MG tablet, Take 5 mg by mouth daily., Disp: , Rfl:    metoprolol succinate (TOPROL-XL) 25 MG 24 hr tablet, Take 1 tablet (25 mg total) by mouth daily. Take with or immediately following a meal., Disp: 90 tablet, Rfl: 1   nitroGLYCERIN (NITROSTAT) 0.4 MG SL tablet, Place 1 tablet (0.4 mg total) under the tongue every 5 (five) minutes as needed for chest pain., Disp: 30 tablet, Rfl: 0  Orders Placed This Encounter  Procedures   Lipid Panel With LDL/HDL Ratio   LDL cholesterol, direct   XBM84+XLKG   EKG 12-Lead    There are no Patient Instructions on file for this visit.   --Continue cardiac  medications as reconciled in final medication list. --Return in about 3 months (around 10/27/2023) for Follow up, CAD, Lipid. Or sooner if needed. --Continue follow-up with your primary care physician regarding the management of your other chronic comorbid conditions.  Patient's questions and concerns were addressed to his satisfaction. He voices understanding of the instructions provided during this encounter.   This note was created using a voice recognition software as a result there may be grammatical errors inadvertently enclosed that do not reflect the nature of this encounter. Every attempt is made to correct such errors.  [name] Haney, [place], [facility]  Pager:  [phone] Office: [phone]

## 2023-08-10 ENCOUNTER — Other Ambulatory Visit: Payer: Self-pay | Admitting: Cardiology

## 2023-08-10 DIAGNOSIS — I2510 Atherosclerotic heart disease of native coronary artery without angina pectoris: Secondary | ICD-10-CM

## 2023-09-14 ENCOUNTER — Other Ambulatory Visit: Payer: Self-pay | Admitting: Cardiology

## 2023-10-27 ENCOUNTER — Other Ambulatory Visit: Payer: Self-pay | Admitting: Cardiology

## 2023-10-27 DIAGNOSIS — I2510 Atherosclerotic heart disease of native coronary artery without angina pectoris: Secondary | ICD-10-CM

## 2023-10-27 DIAGNOSIS — R931 Abnormal findings on diagnostic imaging of heart and coronary circulation: Secondary | ICD-10-CM

## 2023-10-27 DIAGNOSIS — Z789 Other specified health status: Secondary | ICD-10-CM

## 2023-10-27 DIAGNOSIS — E7800 Pure hypercholesterolemia, unspecified: Secondary | ICD-10-CM

## 2023-11-03 ENCOUNTER — Ambulatory Visit: Payer: Self-pay | Admitting: Cardiology

## 2024-02-18 IMAGING — CT CT CARDIAC CORONARY ARTERY CALCIUM SCORE
3 series · 14 of 20 positions shown, 16 images · non-contrast
Comparison: None.

CLINICAL DATA: 30-year-old Caucasian male with history of
hyperlipidemia, family history of heart disease and prior smoking
history.

EXAM:
CT CARDIAC CORONARY ARTERY CALCIUM SCORE
TECHNIQUE: Non-contrast imaging through the heart was performed using
prospective ECG gating. Image post processing was performed on an
independent workstation, allowing for quantitative analysis of the
heart and coronary arteries. Note that this exam targets the heart
and the chest was not imaged in its entirety.

[Series 2: calcium scoring 2.00 qr36 bestdiast 72% hrt calciu · axial · 0.43mm/px · z∈[+1543,+1627]mm · 4 of 70 slices shown]
[im 14/70  vessel]
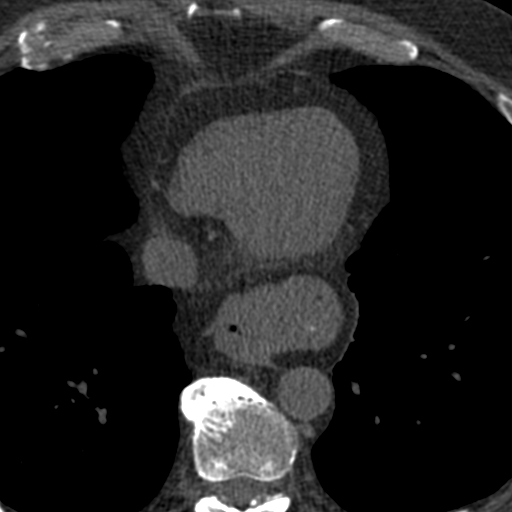
[im 28/70  vessel]
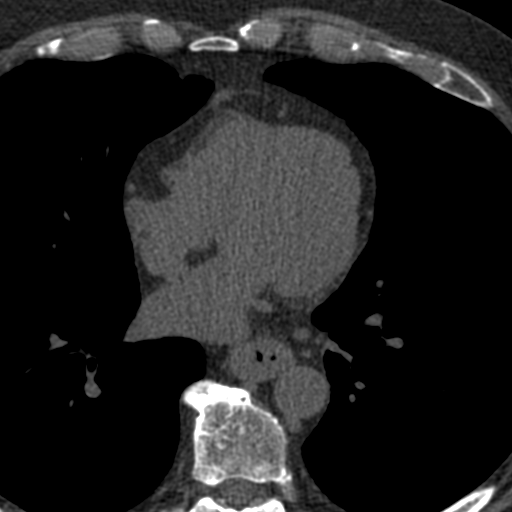
[im 42/70  vessel]
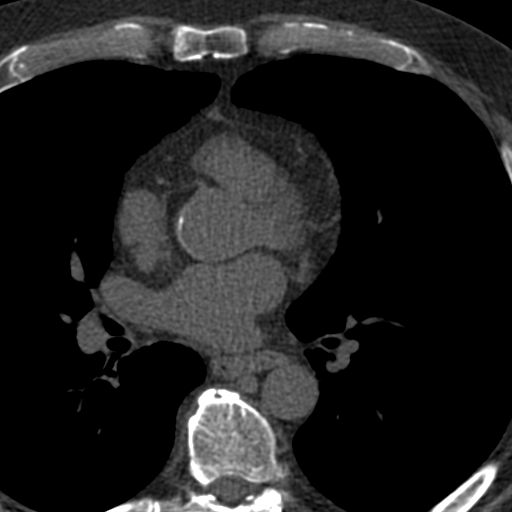
[im 56/70  vessel]
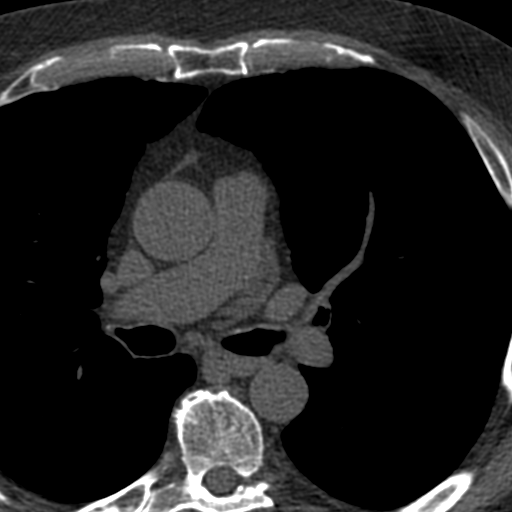

[Series 3: calcium scoring 2.00 br40 bestdiast 72% axial · axial · 0.60mm/px · z∈[+1539,+1631]mm · 5 of 70 slices shown, 7 images]
[im 12/70  vessel]
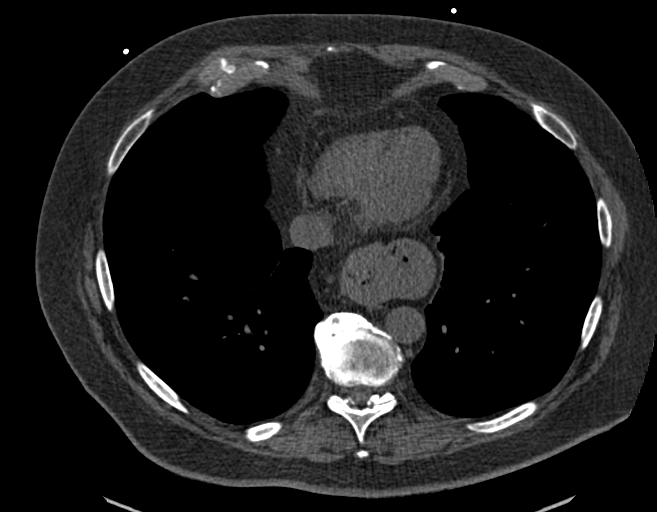
[im 12/70  lung]
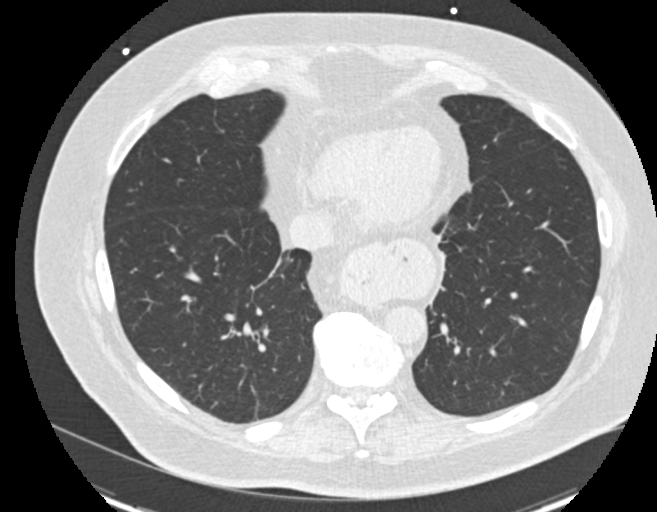
[im 24/70  vessel]
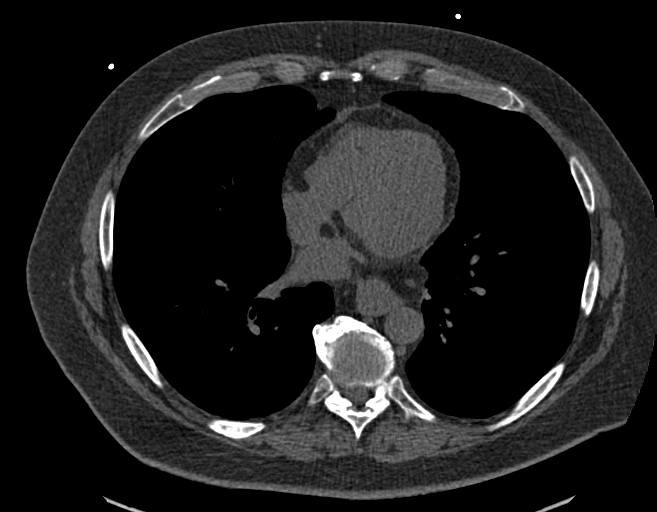
[im 35/70  vessel]
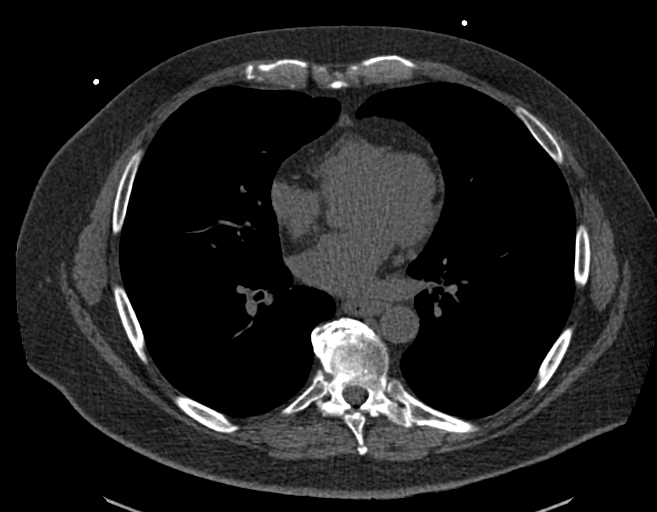
[im 47/70  vessel]
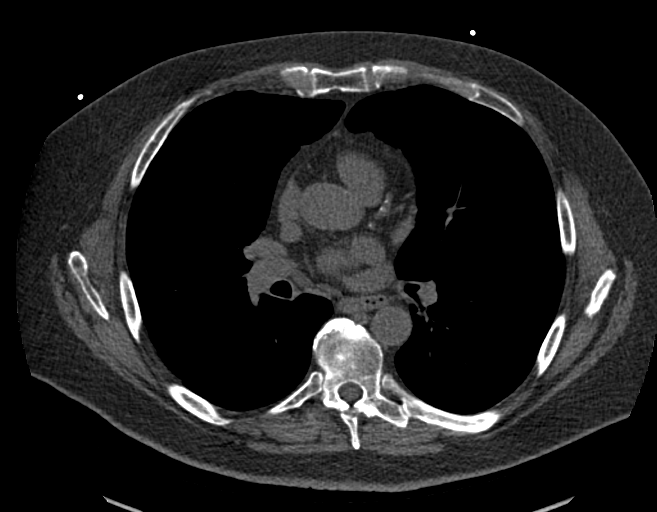
[im 58/70  vessel]
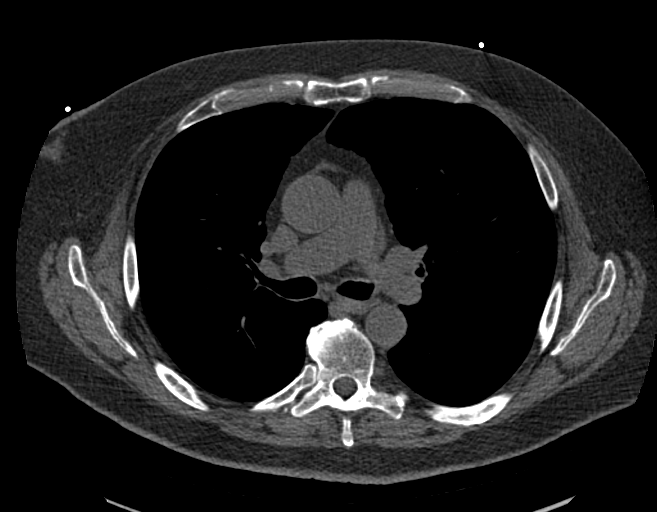
[im 58/70  lung]
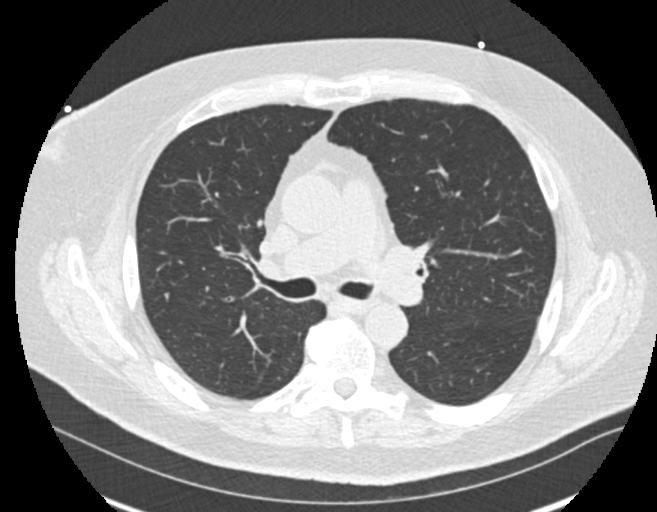

[Series 9: calcium scoring 2.00 br60 bestdiast 72% lungs · axial · 0.60mm/px · z∈[+1539,+1631]mm · 5 of 70 slices shown]
[im 12/70  vessel]
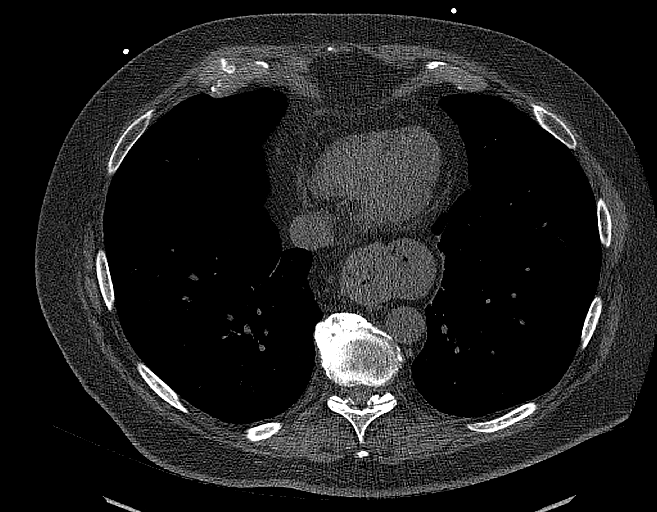
[im 24/70  vessel]
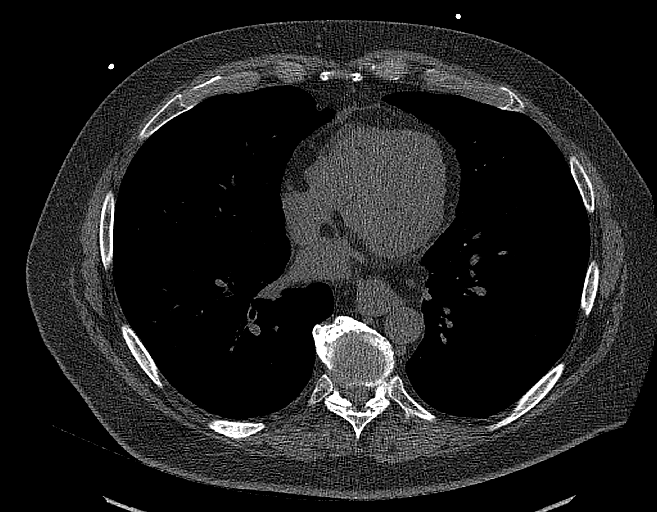
[im 35/70  vessel]
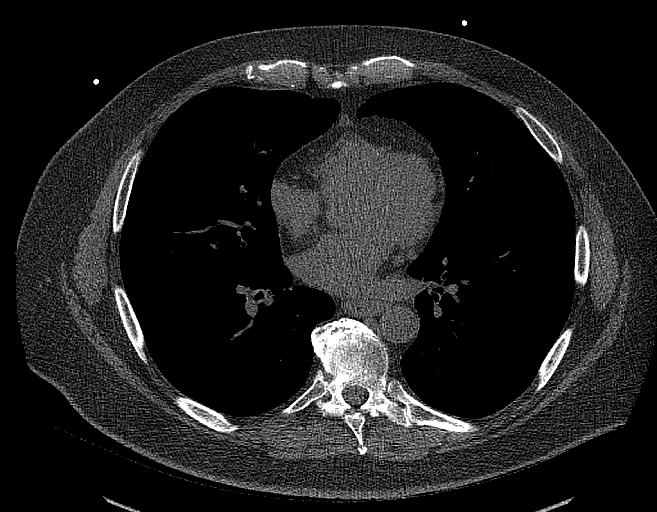
[im 47/70  vessel]
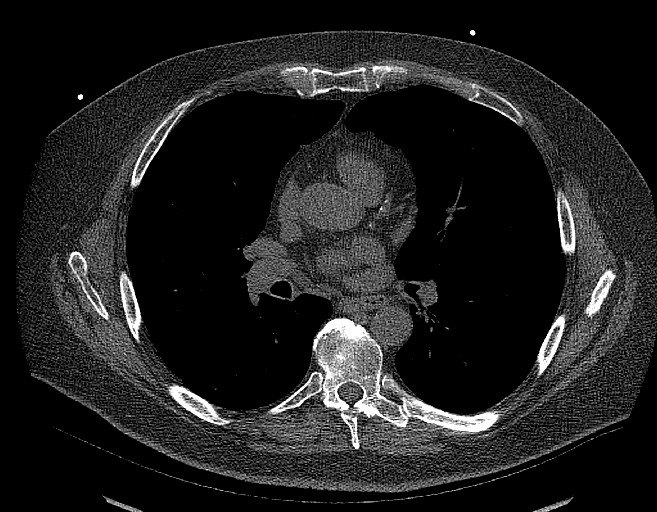
[im 58/70  vessel]
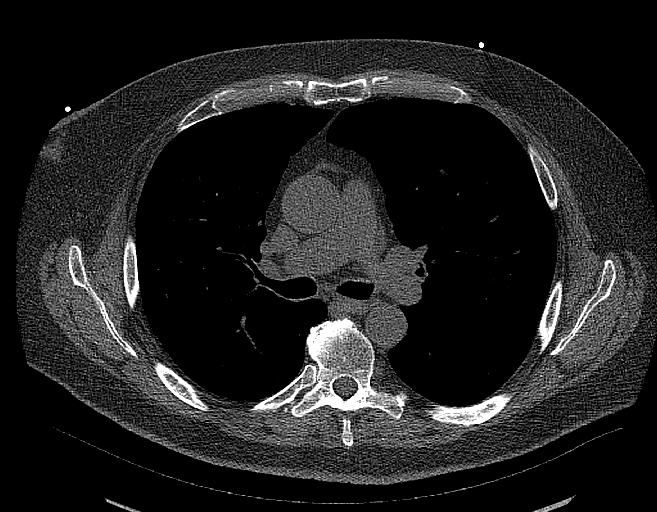

[14 of 20 positions shown; findings below may reference images not displayed]

FINDINGS: CORONARY CALCIUM SCORES:

Left Main: 0

LAD: 55

LCx:

RCA: 0

Total Agatston Score: 57

[HOSPITAL] percentile: 33

AORTA MEASUREMENTS:

Ascending Aorta: 33 mm

Descending Aorta: 24 mm

OTHER FINDINGS:

The heart size is within normal limits. No pericardial fluid is
identified. Visualized segments of the thoracic aorta and central
pulmonary arteries are normal in caliber. Visualized mediastinum and
hilar regions demonstrate no lymphadenopathy or masses. Large hiatal
hernia present. The distal esophagus just proximal to the hernia
demonstrates somewhat prominent wall thickening. This is nonspecific
and may reflect inflammation from chronic reflux. Correlation
suggested with any clinical symptoms. Visualized lungs show no
evidence of pulmonary edema, consolidation, pneumothorax, nodule or
pleural fluid. Visualized upper abdomen and bony structures are
unremarkable.
IMPRESSION: 1. Coronary calcium score of 57 is at the 33rd percentile for the
patient's age, sex and race.
2. Large hiatal hernia. The distal esophagus just proximal to the
herniated stomach appears circumferentially thickened. This is
nonspecific and may reflect inflammation from chronic reflux.
Correlation suggested with any clinical symptoms or prior endoscopy.

## 2024-11-15 ENCOUNTER — Encounter: Payer: Self-pay | Admitting: Internal Medicine
